# Patient Record
Sex: Female | Born: 1955 | Race: White | Hispanic: No | Marital: Married | Smoking: Former smoker
Health system: Southern US, Community
[De-identification: ages and names within clinical notes are randomized; demographics above are authoritative.]

## PROBLEM LIST (undated history)

## (undated) DIAGNOSIS — I2510 Atherosclerotic heart disease of native coronary artery without angina pectoris: Secondary | ICD-10-CM

## (undated) DIAGNOSIS — I10 Essential (primary) hypertension: Secondary | ICD-10-CM

## (undated) DIAGNOSIS — F419 Anxiety disorder, unspecified: Secondary | ICD-10-CM

## (undated) DIAGNOSIS — R062 Wheezing: Secondary | ICD-10-CM

## (undated) DIAGNOSIS — Z780 Asymptomatic menopausal state: Secondary | ICD-10-CM

## (undated) DIAGNOSIS — J069 Acute upper respiratory infection, unspecified: Secondary | ICD-10-CM

## (undated) DIAGNOSIS — M542 Cervicalgia: Secondary | ICD-10-CM

## (undated) DIAGNOSIS — E785 Hyperlipidemia, unspecified: Secondary | ICD-10-CM

## (undated) DIAGNOSIS — R5381 Other malaise: Secondary | ICD-10-CM

## (undated) DIAGNOSIS — R059 Cough, unspecified: Secondary | ICD-10-CM

## (undated) DIAGNOSIS — R011 Cardiac murmur, unspecified: Secondary | ICD-10-CM

## (undated) DIAGNOSIS — T733XXA Exhaustion due to excessive exertion, initial encounter: Secondary | ICD-10-CM

## (undated) DIAGNOSIS — U071 COVID-19: Secondary | ICD-10-CM

## (undated) DIAGNOSIS — R351 Nocturia: Secondary | ICD-10-CM

## (undated) DIAGNOSIS — G2581 Restless legs syndrome: Secondary | ICD-10-CM

## (undated) DIAGNOSIS — E860 Dehydration: Secondary | ICD-10-CM

## (undated) DIAGNOSIS — B029 Zoster without complications: Secondary | ICD-10-CM

## (undated) HISTORY — DX: Asymptomatic menopausal state: Z78.0

## (undated) HISTORY — DX: Wheezing: R06.2

## (undated) HISTORY — DX: Exhaustion due to excessive exertion, initial encounter: T73.3XXA

## (undated) HISTORY — DX: COVID-19: U07.1

## (undated) HISTORY — DX: Acute upper respiratory infection, unspecified: J06.9

## (undated) HISTORY — DX: Zoster without complications: B02.9

## (undated) HISTORY — DX: Cervicalgia: M54.2

## (undated) HISTORY — DX: Anxiety disorder, unspecified: F41.9

## (undated) HISTORY — DX: Nocturia: R35.1

## (undated) HISTORY — DX: Essential (primary) hypertension: I10

## (undated) HISTORY — DX: Cardiac murmur, unspecified: R01.1

## (undated) HISTORY — DX: Atherosclerotic heart disease of native coronary artery without angina pectoris: I25.10

## (undated) HISTORY — DX: Other malaise: R53.81

## (undated) HISTORY — DX: Hyperlipidemia, unspecified: E78.5

## (undated) HISTORY — DX: Restless legs syndrome: G25.81

## (undated) HISTORY — DX: Dehydration: E86.0

## (undated) HISTORY — DX: Cough, unspecified: R05.9

---

## 1997-03-07 ENCOUNTER — Other Ambulatory Visit: Admission: RE | Admit: 1997-03-07 | Discharge: 1997-03-07 | Payer: Self-pay | Admitting: Obstetrics and Gynecology

## 1999-05-21 ENCOUNTER — Other Ambulatory Visit: Admission: RE | Admit: 1999-05-21 | Discharge: 1999-05-21 | Payer: Self-pay | Admitting: Obstetrics and Gynecology

## 2000-05-22 ENCOUNTER — Other Ambulatory Visit: Admission: RE | Admit: 2000-05-22 | Discharge: 2000-05-22 | Payer: Self-pay | Admitting: Obstetrics and Gynecology

## 2001-05-25 ENCOUNTER — Other Ambulatory Visit: Admission: RE | Admit: 2001-05-25 | Discharge: 2001-05-25 | Payer: Self-pay | Admitting: Obstetrics and Gynecology

## 2001-06-11 ENCOUNTER — Ambulatory Visit (HOSPITAL_COMMUNITY): Admission: RE | Admit: 2001-06-11 | Discharge: 2001-06-11 | Payer: Self-pay | Admitting: Obstetrics and Gynecology

## 2001-06-11 ENCOUNTER — Encounter: Payer: Self-pay | Admitting: Obstetrics and Gynecology

## 2002-08-14 ENCOUNTER — Encounter: Payer: Self-pay | Admitting: Obstetrics and Gynecology

## 2002-08-14 ENCOUNTER — Ambulatory Visit (HOSPITAL_COMMUNITY): Admission: RE | Admit: 2002-08-14 | Discharge: 2002-08-14 | Payer: Self-pay | Admitting: Obstetrics and Gynecology

## 2003-08-18 ENCOUNTER — Ambulatory Visit (HOSPITAL_COMMUNITY): Admission: RE | Admit: 2003-08-18 | Discharge: 2003-08-18 | Payer: Self-pay | Admitting: Obstetrics and Gynecology

## 2003-09-08 ENCOUNTER — Ambulatory Visit (HOSPITAL_COMMUNITY): Admission: RE | Admit: 2003-09-08 | Discharge: 2003-09-08 | Payer: Self-pay | Admitting: Obstetrics and Gynecology

## 2004-06-30 ENCOUNTER — Ambulatory Visit (HOSPITAL_COMMUNITY): Admission: RE | Admit: 2004-06-30 | Discharge: 2004-06-30 | Payer: Self-pay | Admitting: Obstetrics and Gynecology

## 2004-08-19 ENCOUNTER — Ambulatory Visit (HOSPITAL_COMMUNITY): Admission: RE | Admit: 2004-08-19 | Discharge: 2004-08-19 | Payer: Self-pay | Admitting: Obstetrics and Gynecology

## 2004-11-09 ENCOUNTER — Ambulatory Visit (HOSPITAL_COMMUNITY): Admission: RE | Admit: 2004-11-09 | Discharge: 2004-11-09 | Payer: Self-pay | Admitting: Orthopedic Surgery

## 2004-12-22 ENCOUNTER — Ambulatory Visit (HOSPITAL_COMMUNITY): Admission: RE | Admit: 2004-12-22 | Discharge: 2004-12-22 | Payer: Self-pay | Admitting: Internal Medicine

## 2006-08-16 ENCOUNTER — Other Ambulatory Visit: Admission: RE | Admit: 2006-08-16 | Discharge: 2006-08-16 | Payer: Self-pay | Admitting: Gynecology

## 2007-08-21 ENCOUNTER — Other Ambulatory Visit: Admission: RE | Admit: 2007-08-21 | Discharge: 2007-08-21 | Payer: Self-pay | Admitting: Gynecology

## 2012-09-14 ENCOUNTER — Ambulatory Visit (INDEPENDENT_AMBULATORY_CARE_PROVIDER_SITE_OTHER): Payer: Federal, State, Local not specified - PPO | Admitting: Internal Medicine

## 2012-09-14 ENCOUNTER — Encounter: Payer: Self-pay | Admitting: Internal Medicine

## 2012-09-14 VITALS — BP 150/80 | HR 52 | Temp 97.0°F | Ht 66.0 in | Wt 148.2 lb

## 2012-09-14 DIAGNOSIS — R05 Cough: Secondary | ICD-10-CM

## 2012-09-14 DIAGNOSIS — R059 Cough, unspecified: Secondary | ICD-10-CM

## 2012-09-14 MED ORDER — AMOXICILLIN-POT CLAVULANATE 875-125 MG PO TABS: 1.0000 | ORAL_TABLET | Freq: Two times a day (BID) | ORAL | Status: DC

## 2012-09-14 MED ORDER — PREDNISONE (PAK) 10 MG PO TABS: ORAL_TABLET | ORAL | Status: DC

## 2012-09-14 MED ORDER — TRAMADOL HCL 50 MG PO TABS: ORAL_TABLET | ORAL | Status: DC

## 2012-09-14 NOTE — Progress Notes (Signed)
  Subjective:    Patient ID: Jordan Wolfe, female    DOB: 1955/10/13   MRN: [id number]  HPI  60 yowf quit smoking 1994  New onset cough referred by Ritta Slot to pulmonary clinic 09/14/12  09/14/2012 1st [facility] office visit/ Errica Dutil cc abrupt onset cold like symptoms first week in August 2014 with intermittently yellow mucus esp in am's immediately after waking.  rx  allegra muccinex.  Already zpak  No better with inhaler or otcs - only sob when coughing  No obvious  Day to day  variabilty or assoc cp or chest tightness, subjective wheeze overt sinus or hb symptoms. No unusual exp hx or h/o childhood pna/ asthma or knowledge of premature birth.    does not disturb sleep typically once asleep.  Also denies any obvious fluctuation of symptoms with weather or environmental changes or other aggravating or alleviating factors except as outlined above  Current Medications, Allergies, Past Medical History, Past Surgical History, Family History, and Social History were reviewed in [employer] record.         Review of Systems  Constitutional: Negative for fever, chills and unexpected weight change.  HENT: Positive for sore throat. Negative for ear pain, nosebleeds, congestion, rhinorrhea, sneezing, trouble swallowing, dental problem, voice change, postnasal drip and sinus pressure.   Eyes: Negative for visual disturbance.  Respiratory: Positive for shortness of breath. Negative for cough and choking.   Cardiovascular: Negative for chest pain and leg swelling.  Gastrointestinal: Negative for vomiting, abdominal pain and diarrhea.  Genitourinary: Negative for difficulty urinating.  Musculoskeletal: Negative for arthralgias.  Skin: Negative for rash.  Neurological: Positive for headaches. Negative for tremors and syncope.  Hematological: Does not bruise/bleed easily.       Objective:   Physical Exam  Pleasant amb wf nad nasal tone to voice   Wt Readings from  Last 3 Encounters:  09/14/12 148 lb 3.2 oz (67.223 kg)      HEENT: mod non specific turbinate edema but nl dentition,  , and orophanx. Nl external ear canals without cough reflex   NECK :  without JVD/Nodes/TM/ nl carotid upstrokes bilaterally   LUNGS: no acc muscle use, clear to A and P bilaterally without cough on insp or exp maneuvers   CV:  RRR  no s3 or murmur or increase in P2, no edema   ABD:  soft and nontender with nl excursion in the supine position. No bruits or organomegaly, bowel sounds nl  MS:  warm without deformities, calf tenderness, cyanosis or clubbing  SKIN: warm and dry without lesions    NEURO:  alert, approp, no deficits    CXR review Aug 22 2012  No active dz    Assessment & Plan:

## 2012-09-14 NOTE — Patient Instructions (Addendum)
augmentin 875 one twice daily with a glass of water and yogurt for lunch   Prednisone 10 mg take  4 each am x 2 days,   2 each am x 2 days,  1 each am x 2 days and stop   mucinex dm 1200 mg every 12 hours as needed for cough.   Tramadol 50 mg one every 4 hours if still coughing   pepcid ac 20 mg one after bfast and supper as long as you coughing   GERD (REFLUX)  is an extremely common cause of respiratory symptoms, many times with no significant heartburn at all.    It can be treated with medication, but also with lifestyle changes including avoidance of late meals, excessive alcohol, smoking cessation, and avoid fatty foods, chocolate, peppermint, colas, red wine, and acidic juices such as orange juice.  NO MINT OR MENTHOL PRODUCTS SO NO COUGH DROPS  USE SUGARLESS CANDY INSTEAD (jolley ranchers or Stover's)  NO OIL BASED VITAMINS - use powdered substitutes.    If not 100% better in 2 weeks need a sinus ct call [facility] [phone]

## 2012-09-15 DIAGNOSIS — R05 Cough: Secondary | ICD-10-CM | POA: Insufficient documentation

## 2012-09-15 DIAGNOSIS — R059 Cough, unspecified: Secondary | ICD-10-CM | POA: Insufficient documentation

## 2012-09-15 NOTE — Assessment & Plan Note (Addendum)
The most common causes of chronic cough in immunocompetent adults include the following: upper airway cough syndrome (UACS), previously referred to as postnasal drip syndrome (PNDS), which is caused by variety of rhinosinus conditions; (2) asthma; (3) GERD; (4) chronic bronchitis from cigarette smoking or other inhaled environmental irritants; (5) nonasthmatic eosinophilic bronchitis; and (6) bronchiectasis.   These conditions, singly or in combination, have accounted for up to 94% of the causes of chronic cough in prospective studies.   Other conditions have constituted no >6% of the causes in prospective studies These have included bronchogenic carcinoma, chronic interstitial pneumonia, sarcoidosis, left ventricular failure, ACEI-induced cough, and aspiration from a condition associated with pharyngeal dysfunction.    Chronic cough is often simultaneously caused by more than one condition. A single cause has been found from 38 to 82% of the time, multiple causes from 18 to 62%. Multiply caused cough has been the result of three diseases up to 42% of the time.       Most likely this is  Classic Upper airway cough syndrome, so named because it's frequently impossible to sort out how much is  CR/sinusitis with freq throat clearing (which can be related to primary GERD)   vs  causing  secondary (" extra esophageal")  GERD from wide swings in gastric pressure that occur with throat clearing, often  promoting self use of mint and menthol lozenges that reduce the lower esophageal sphincter tone and exacerbate the problem further in a cyclical fashion.   These are the same pts (now being labeled as having "irritable larynx syndrome" by some cough centers) who not infrequently have a history of having failed to tolerate ace inhibitors,  dry powder inhalers or biphosphonates or report having atypical reflux symptoms that don't respond to standard doses of PPI , and are easily confused as having aecopd or asthma  flares by even experienced allergists/ pulmonologists.   For now try max rx directed at sinuses (augmentin x 10 d)  and cyclical cough then sinus ct if not better  See instructions for specific recommendations which were reviewed directly with the patient who was given a copy with highlighter outlining the key components.

## 2014-09-02 ENCOUNTER — Emergency Department (HOSPITAL_COMMUNITY): Payer: Federal, State, Local not specified - PPO

## 2014-09-02 ENCOUNTER — Emergency Department (HOSPITAL_COMMUNITY)
Admission: EM | Admit: 2014-09-02 | Discharge: 2014-09-02 | Disposition: A | Discharge status: Home / Self Care | Payer: Federal, State, Local not specified - PPO | Attending: Emergency Medicine | Admitting: Emergency Medicine

## 2014-09-02 ENCOUNTER — Encounter (HOSPITAL_COMMUNITY): Payer: Self-pay | Admitting: *Deleted

## 2014-09-02 DIAGNOSIS — Z87891 Personal history of nicotine dependence: Secondary | ICD-10-CM | POA: Insufficient documentation

## 2014-09-02 DIAGNOSIS — S50311A Abrasion of right elbow, initial encounter: Secondary | ICD-10-CM | POA: Diagnosis not present

## 2014-09-02 DIAGNOSIS — Y998 Other external cause status: Secondary | ICD-10-CM | POA: Diagnosis not present

## 2014-09-02 DIAGNOSIS — S40011A Contusion of right shoulder, initial encounter: Secondary | ICD-10-CM | POA: Diagnosis not present

## 2014-09-02 DIAGNOSIS — S80212A Abrasion, left knee, initial encounter: Secondary | ICD-10-CM | POA: Diagnosis not present

## 2014-09-02 DIAGNOSIS — Z79899 Other long term (current) drug therapy: Secondary | ICD-10-CM | POA: Insufficient documentation

## 2014-09-02 DIAGNOSIS — S80211A Abrasion, right knee, initial encounter: Secondary | ICD-10-CM | POA: Insufficient documentation

## 2014-09-02 DIAGNOSIS — R410 Disorientation, unspecified: Secondary | ICD-10-CM | POA: Diagnosis not present

## 2014-09-02 DIAGNOSIS — Y92410 Unspecified street and highway as the place of occurrence of the external cause: Secondary | ICD-10-CM | POA: Insufficient documentation

## 2014-09-02 DIAGNOSIS — Y9389 Activity, other specified: Secondary | ICD-10-CM | POA: Diagnosis not present

## 2014-09-02 DIAGNOSIS — S0990XA Unspecified injury of head, initial encounter: Secondary | ICD-10-CM | POA: Diagnosis present

## 2014-09-02 LAB — BASIC METABOLIC PANEL
ANION GAP: 10 (ref 5–15)
BUN: 18 mg/dL (ref 6–20)
CALCIUM: 9.7 mg/dL (ref 8.9–10.3)
CO2: 24 mmol/L (ref 22–32)
CREATININE: 0.72 mg/dL (ref 0.44–1.00)
Chloride: 104 mmol/L (ref 101–111)
GFR calc Af Amer: >60 mL/min (ref >60)
GFR calc non Af Amer: >60 mL/min (ref >60)
GLUCOSE: 106 mg/dL — AB (ref 65–99)
Potassium: 5 mmol/L (ref 3.5–5.1)
Sodium: 138 mmol/L (ref 135–145)

## 2014-09-02 LAB — CBC
HEMATOCRIT: 39.2 % (ref 36.0–46.0)
Hemoglobin: 13.2 g/dL (ref 12.0–15.0)
MCH: 30.2 pg (ref 26.0–34.0)
MCHC: 33.7 g/dL (ref 30.0–36.0)
MCV: 89.7 fL (ref 78.0–100.0)
Platelets: 235 10*3/uL (ref 150–400)
RBC: 4.37 MIL/uL (ref 3.87–5.11)
RDW: 13.5 % (ref 11.5–15.5)
WBC: 7.8 10*3/uL (ref 4.0–10.5)

## 2014-09-02 MED ORDER — ONDANSETRON 4 MG PO TBDP
4.0000 mg | ORAL_TABLET | Freq: Once | ORAL | Status: AC
  Administered 2014-09-02: 4 mg via ORAL

## 2014-09-02 MED ORDER — ONDANSETRON 4 MG PO TBDP
ORAL_TABLET | ORAL | Status: AC
  Filled 2014-09-02: qty 1

## 2014-09-02 MED ORDER — OXYCODONE-ACETAMINOPHEN 5-325 MG PO TABS: 2.0000 | ORAL_TABLET | Freq: Once | ORAL | Status: DC

## 2014-09-02 MED ORDER — TETANUS-DIPHTH-ACELL PERTUSSIS 5-2.5-18.5 LF-MCG/0.5 IM SUSP
0.5000 mL | Freq: Once | INTRAMUSCULAR | Status: AC
  Administered 2014-09-02: 0.5 mL via INTRAMUSCULAR
  Filled 2014-09-02: qty 0.5
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
## 2014-09-02 NOTE — Discharge Instructions (Signed)
Concussion  A concussion, or closed-head injury, is a brain injury caused by a direct blow to the head or by a quick and sudden movement (jolt) of the head or neck. Concussions are usually not life-threatening. Even so, the effects of a concussion can be serious. If you have had a concussion before, you are more likely to experience concussion-like symptoms after a direct blow to the head.   CAUSES  · Direct blow to the head, such as from running into another player during a soccer game, being hit in a fight, or hitting your head on a hard surface.  · A jolt of the head or neck that causes the brain to move back and forth inside the skull, such as in a car crash.  SIGNS AND SYMPTOMS  The signs of a concussion can be hard to notice. Early on, they may be missed by you, family members, and health care providers. You may look fine but act or feel differently.  Symptoms are usually temporary, but they may last for days, weeks, or even longer. Some symptoms may appear right away while others may not show up for hours or days. Every head injury is different. Symptoms include:  · Mild to moderate headaches that will not go away.  · A feeling of pressure inside your head.  · Having more trouble than usual:  ¨ Learning or remembering things you have heard.  ¨ Answering questions.  ¨ Paying attention or concentrating.  ¨ Organizing daily tasks.  ¨ Making decisions and solving problems.  · Slowness in thinking, acting or reacting, speaking, or reading.  · Getting lost or being easily confused.  · Feeling tired all the time or lacking energy (fatigued).  · Feeling drowsy.  · Sleep disturbances.  ¨ Sleeping more than usual.  ¨ Sleeping less than usual.  ¨ Trouble falling asleep.  ¨ Trouble sleeping (insomnia).  · Loss of balance or feeling lightheaded or dizzy.  · Nausea or vomiting.  · Numbness or tingling.  · Increased sensitivity to:  ¨ Sounds.  ¨ Lights.  ¨ Distractions.  · Vision problems or eyes that tire  easily.  · Diminished sense of taste or smell.  · Ringing in the ears.  · Mood changes such as feeling sad or anxious.  · Becoming easily irritated or angry for little or no reason.  · Lack of motivation.  · Seeing or hearing things other people do not see or hear (hallucinations).  DIAGNOSIS  Your health care provider can usually diagnose a concussion based on a description of your injury and symptoms. He or she will ask whether you passed out (lost consciousness) and whether you are having trouble remembering events that happened right before and during your injury.  Your evaluation might include:  · A brain scan to look for signs of injury to the brain. Even if the test shows no injury, you may still have a concussion.  · Blood tests to be sure other problems are not present.  TREATMENT  · Concussions are usually treated in an emergency department, in urgent care, or at a clinic. You may need to stay in the hospital overnight for further treatment.  · Tell your health care provider if you are taking any medicines, including prescription medicines, over-the-counter medicines, and natural remedies. Some medicines, such as blood thinners (anticoagulants) and aspirin, may increase the chance of complications. Also tell your health care provider whether you have had alcohol or are taking illegal drugs. This information   may affect treatment.  · Your health care provider will send you home with important instructions to follow.  · How fast you will recover from a concussion depends on many factors. These factors include how severe your concussion is, what part of your brain was injured, your age, and how healthy you were before the concussion.  · Most people with mild injuries recover fully. Recovery can take time. In general, recovery is slower in older persons. Also, persons who have had a concussion in the past or have other medical problems may find that it takes longer to recover from their current injury.  HOME  CARE INSTRUCTIONS  General Instructions  · Carefully follow the directions your health care provider gave you.  · Only take over-the-counter or prescription medicines for pain, discomfort, or fever as directed by your health care provider.  · Take only those medicines that your health care provider has approved.  · Do not drink alcohol until your health care provider says you are well enough to do so. Alcohol and certain other drugs may slow your recovery and can put you at risk of further injury.  · If it is harder than usual to remember things, write them down.  · If you are easily distracted, try to do one thing at a time. For example, do not try to watch TV while fixing dinner.  · Talk with family members or close friends when making important decisions.  · Keep all follow-up appointments. Repeated evaluation of your symptoms is recommended for your recovery.  · Watch your symptoms and tell others to do the same. Complications sometimes occur after a concussion. Older adults with a brain injury may have a higher risk of serious complications, such as a blood clot on the brain.  · Tell your teachers, school nurse, school counselor, coach, athletic trainer, or work manager about your injury, symptoms, and restrictions. Tell them about what you can or cannot do. They should watch for:  ¨ Increased problems with attention or concentration.  ¨ Increased difficulty remembering or learning new information.  ¨ Increased time needed to complete tasks or assignments.  ¨ Increased irritability or decreased ability to cope with stress.  ¨ Increased symptoms.  · Rest. Rest helps the brain to heal. Make sure you:  ¨ Get plenty of sleep at night. Avoid staying up late at night.  ¨ Keep the same bedtime hours on weekends and weekdays.  ¨ Rest during the day. Take daytime naps or rest breaks when you feel tired.  · Limit activities that require a lot of thought or concentration. These include:  ¨ Doing homework or job-related  work.  ¨ Watching TV.  ¨ Working on the computer.  · Avoid any situation where there is potential for another head injury (football, hockey, soccer, basketball, martial arts, downhill snow sports and horseback riding). Your condition will get worse every time you experience a concussion. You should avoid these activities until you are evaluated by the appropriate follow-up health care providers.  Returning To Your Regular Activities  You will need to return to your normal activities slowly, not all at once. You must give your body and brain enough time for recovery.  · Do not return to sports or other athletic activities until your health care provider tells you it is safe to do so.  · Ask your health care provider when you can drive, ride a bicycle, or operate heavy machinery. Your ability to react may be slower after a   brain injury. Never do these activities if you are dizzy.  · Ask your health care provider about when you can return to work or school.  Preventing Another Concussion  It is very important to avoid another brain injury, especially before you have recovered. In rare cases, another injury can lead to permanent brain damage, brain swelling, or death. The risk of this is greatest during the first 7-10 days after a head injury. Avoid injuries by:  · Wearing a seat belt when riding in a car.  · Drinking alcohol only in moderation.  · Wearing a helmet when biking, skiing, skateboarding, skating, or doing similar activities.  · Avoiding activities that could lead to a second concussion, such as contact or recreational sports, until your health care provider says it is okay.  · Taking safety measures in your home.  ¨ Remove clutter and tripping hazards from floors and stairways.  ¨ Use grab bars in bathrooms and handrails by stairs.  ¨ Place non-slip mats on floors and in bathtubs.  ¨ Improve lighting in dim areas.  SEEK MEDICAL CARE IF:  · You have increased problems paying attention or  concentrating.  · You have increased difficulty remembering or learning new information.  · You need more time to complete tasks or assignments than before.  · You have increased irritability or decreased ability to cope with stress.  · You have more symptoms than before.  Seek medical care if you have any of the following symptoms for more than 2 weeks after your injury:  · Lasting (chronic) headaches.  · Dizziness or balance problems.  · Nausea.  · Vision problems.  · Increased sensitivity to noise or light.  · Depression or mood swings.  · Anxiety or irritability.  · Memory problems.  · Difficulty concentrating or paying attention.  · Sleep problems.  · Feeling tired all the time.  SEEK IMMEDIATE MEDICAL CARE IF:  · You have severe or worsening headaches. These may be a sign of a blood clot in the brain.  · You have weakness (even if only in one hand, leg, or part of the face).  · You have numbness.  · You have decreased coordination.  · You vomit repeatedly.  · You have increased sleepiness.  · One pupil is larger than the other.  · You have convulsions.  · You have slurred speech.  · You have increased confusion. This may be a sign of a blood clot in the brain.  · You have increased restlessness, agitation, or irritability.  · You are unable to recognize people or places.  · You have neck pain.  · It is difficult to wake you up.  · You have unusual behavior changes.  · You lose consciousness.  MAKE SURE YOU:  · Understand these instructions.  · Will watch your condition.  · Will get help right away if you are not doing well or get worse.  Document Released: 03/19/2003 Document Revised: 01/01/2013 Document Reviewed: 07/19/2012  Exit[employer]® Patient Information ©2015 [employer]. This information is not intended to replace advice given to you by your health care provider. Make sure you discuss any questions you have with your health care provider.
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
## 2015-04-13 DIAGNOSIS — Z85828 Personal history of other malignant neoplasm of skin: Secondary | ICD-10-CM | POA: Diagnosis not present

## 2015-04-13 DIAGNOSIS — L812 Freckles: Secondary | ICD-10-CM | POA: Diagnosis not present

## 2015-04-13 DIAGNOSIS — D225 Melanocytic nevi of trunk: Secondary | ICD-10-CM | POA: Diagnosis not present

## 2015-04-13 DIAGNOSIS — L821 Other seborrheic keratosis: Secondary | ICD-10-CM | POA: Diagnosis not present

## 2015-05-08 DIAGNOSIS — H3562 Retinal hemorrhage, left eye: Secondary | ICD-10-CM | POA: Diagnosis not present

## 2015-05-08 DIAGNOSIS — H5201 Hypermetropia, right eye: Secondary | ICD-10-CM | POA: Diagnosis not present

## 2015-05-08 DIAGNOSIS — H5212 Myopia, left eye: Secondary | ICD-10-CM | POA: Diagnosis not present

## 2015-05-14 DIAGNOSIS — H3561 Retinal hemorrhage, right eye: Secondary | ICD-10-CM | POA: Diagnosis not present

## 2015-06-16 DIAGNOSIS — H3561 Retinal hemorrhage, right eye: Secondary | ICD-10-CM | POA: Diagnosis not present

## 2015-09-21 DIAGNOSIS — K080 Exfoliation of teeth due to systemic causes: Secondary | ICD-10-CM | POA: Diagnosis not present

## 2015-10-20 DIAGNOSIS — Z1231 Encounter for screening mammogram for malignant neoplasm of breast: Secondary | ICD-10-CM | POA: Diagnosis not present

## 2015-12-16 DIAGNOSIS — H3561 Retinal hemorrhage, right eye: Secondary | ICD-10-CM | POA: Diagnosis not present

## 2016-01-13 DIAGNOSIS — Z6825 Body mass index (BMI) 25.0-25.9, adult: Secondary | ICD-10-CM | POA: Diagnosis not present

## 2016-01-13 DIAGNOSIS — Z01419 Encounter for gynecological examination (general) (routine) without abnormal findings: Secondary | ICD-10-CM | POA: Diagnosis not present

## 2016-03-25 DIAGNOSIS — H2513 Age-related nuclear cataract, bilateral: Secondary | ICD-10-CM | POA: Diagnosis not present

## 2016-03-25 DIAGNOSIS — H3561 Retinal hemorrhage, right eye: Secondary | ICD-10-CM | POA: Diagnosis not present

## 2016-03-25 DIAGNOSIS — H5212 Myopia, left eye: Secondary | ICD-10-CM | POA: Diagnosis not present

## 2016-04-04 DIAGNOSIS — K080 Exfoliation of teeth due to systemic causes: Secondary | ICD-10-CM | POA: Diagnosis not present

## 2016-04-19 DIAGNOSIS — Z85828 Personal history of other malignant neoplasm of skin: Secondary | ICD-10-CM | POA: Diagnosis not present

## 2016-04-19 DIAGNOSIS — D2262 Melanocytic nevi of left upper limb, including shoulder: Secondary | ICD-10-CM | POA: Diagnosis not present

## 2016-04-19 DIAGNOSIS — L570 Actinic keratosis: Secondary | ICD-10-CM | POA: Diagnosis not present

## 2016-04-19 DIAGNOSIS — D2261 Melanocytic nevi of right upper limb, including shoulder: Secondary | ICD-10-CM | POA: Diagnosis not present

## 2016-04-19 DIAGNOSIS — D225 Melanocytic nevi of trunk: Secondary | ICD-10-CM | POA: Diagnosis not present

## 2016-08-31 DIAGNOSIS — R5381 Other malaise: Secondary | ICD-10-CM | POA: Diagnosis not present

## 2016-08-31 DIAGNOSIS — Z0000 Encounter for general adult medical examination without abnormal findings: Secondary | ICD-10-CM | POA: Diagnosis not present

## 2016-08-31 DIAGNOSIS — H3561 Retinal hemorrhage, right eye: Secondary | ICD-10-CM | POA: Diagnosis not present

## 2016-09-06 DIAGNOSIS — Z1212 Encounter for screening for malignant neoplasm of rectum: Secondary | ICD-10-CM | POA: Diagnosis not present

## 2016-09-06 DIAGNOSIS — B029 Zoster without complications: Secondary | ICD-10-CM | POA: Diagnosis not present

## 2016-09-06 DIAGNOSIS — F418 Other specified anxiety disorders: Secondary | ICD-10-CM | POA: Diagnosis not present

## 2016-09-06 DIAGNOSIS — R030 Elevated blood-pressure reading, without diagnosis of hypertension: Secondary | ICD-10-CM | POA: Diagnosis not present

## 2016-09-06 DIAGNOSIS — Z23 Encounter for immunization: Secondary | ICD-10-CM | POA: Diagnosis not present

## 2016-09-06 DIAGNOSIS — Z0000 Encounter for general adult medical examination without abnormal findings: Secondary | ICD-10-CM | POA: Diagnosis not present

## 2016-09-06 DIAGNOSIS — Z780 Asymptomatic menopausal state: Secondary | ICD-10-CM | POA: Diagnosis not present

## 2016-09-06 DIAGNOSIS — Z1389 Encounter for screening for other disorder: Secondary | ICD-10-CM | POA: Diagnosis not present

## 2016-10-17 DIAGNOSIS — K080 Exfoliation of teeth due to systemic causes: Secondary | ICD-10-CM | POA: Diagnosis not present

## 2016-10-27 DIAGNOSIS — Z23 Encounter for immunization: Secondary | ICD-10-CM | POA: Diagnosis not present

## 2016-11-14 DIAGNOSIS — L03011 Cellulitis of right finger: Secondary | ICD-10-CM | POA: Diagnosis not present

## 2016-11-22 DIAGNOSIS — Z1231 Encounter for screening mammogram for malignant neoplasm of breast: Secondary | ICD-10-CM | POA: Diagnosis not present

## 2017-01-16 DIAGNOSIS — Z780 Asymptomatic menopausal state: Secondary | ICD-10-CM | POA: Diagnosis not present

## 2017-01-17 DIAGNOSIS — Z6824 Body mass index (BMI) 24.0-24.9, adult: Secondary | ICD-10-CM | POA: Diagnosis not present

## 2017-01-17 DIAGNOSIS — Z01419 Encounter for gynecological examination (general) (routine) without abnormal findings: Secondary | ICD-10-CM | POA: Diagnosis not present

## 2017-02-09 DIAGNOSIS — D259 Leiomyoma of uterus, unspecified: Secondary | ICD-10-CM | POA: Diagnosis not present

## 2017-02-09 DIAGNOSIS — Z809 Family history of malignant neoplasm, unspecified: Secondary | ICD-10-CM | POA: Diagnosis not present

## 2017-02-09 DIAGNOSIS — N950 Postmenopausal bleeding: Secondary | ICD-10-CM | POA: Diagnosis not present

## 2017-04-17 DIAGNOSIS — K080 Exfoliation of teeth due to systemic causes: Secondary | ICD-10-CM | POA: Diagnosis not present

## 2017-05-15 DIAGNOSIS — Z85828 Personal history of other malignant neoplasm of skin: Secondary | ICD-10-CM | POA: Diagnosis not present

## 2017-05-15 DIAGNOSIS — L814 Other melanin hyperpigmentation: Secondary | ICD-10-CM | POA: Diagnosis not present

## 2017-05-15 DIAGNOSIS — L570 Actinic keratosis: Secondary | ICD-10-CM | POA: Diagnosis not present

## 2017-05-15 DIAGNOSIS — D1801 Hemangioma of skin and subcutaneous tissue: Secondary | ICD-10-CM | POA: Diagnosis not present

## 2017-06-13 DIAGNOSIS — H43823 Vitreomacular adhesion, bilateral: Secondary | ICD-10-CM | POA: Diagnosis not present

## 2017-06-29 DIAGNOSIS — L570 Actinic keratosis: Secondary | ICD-10-CM | POA: Diagnosis not present

## 2017-10-09 DIAGNOSIS — Z0000 Encounter for general adult medical examination without abnormal findings: Secondary | ICD-10-CM | POA: Diagnosis not present

## 2017-10-09 DIAGNOSIS — Z79899 Other long term (current) drug therapy: Secondary | ICD-10-CM | POA: Diagnosis not present

## 2017-10-16 DIAGNOSIS — B029 Zoster without complications: Secondary | ICD-10-CM | POA: Diagnosis not present

## 2017-10-16 DIAGNOSIS — Z0000 Encounter for general adult medical examination without abnormal findings: Secondary | ICD-10-CM | POA: Diagnosis not present

## 2017-10-16 DIAGNOSIS — Z23 Encounter for immunization: Secondary | ICD-10-CM | POA: Diagnosis not present

## 2017-10-16 DIAGNOSIS — F418 Other specified anxiety disorders: Secondary | ICD-10-CM | POA: Diagnosis not present

## 2017-10-16 DIAGNOSIS — Z780 Asymptomatic menopausal state: Secondary | ICD-10-CM | POA: Diagnosis not present

## 2017-10-16 DIAGNOSIS — R030 Elevated blood-pressure reading, without diagnosis of hypertension: Secondary | ICD-10-CM | POA: Diagnosis not present

## 2017-10-17 DIAGNOSIS — R351 Nocturia: Secondary | ICD-10-CM | POA: Diagnosis not present

## 2017-10-25 DIAGNOSIS — R030 Elevated blood-pressure reading, without diagnosis of hypertension: Secondary | ICD-10-CM | POA: Diagnosis not present

## 2017-11-01 DIAGNOSIS — R030 Elevated blood-pressure reading, without diagnosis of hypertension: Secondary | ICD-10-CM | POA: Diagnosis not present

## 2017-11-02 DIAGNOSIS — K080 Exfoliation of teeth due to systemic causes: Secondary | ICD-10-CM | POA: Diagnosis not present

## 2017-12-28 DIAGNOSIS — Z1231 Encounter for screening mammogram for malignant neoplasm of breast: Secondary | ICD-10-CM | POA: Diagnosis not present

## 2018-06-11 DIAGNOSIS — Z01419 Encounter for gynecological examination (general) (routine) without abnormal findings: Secondary | ICD-10-CM | POA: Diagnosis not present

## 2018-06-11 DIAGNOSIS — Z6825 Body mass index (BMI) 25.0-25.9, adult: Secondary | ICD-10-CM | POA: Diagnosis not present

## 2018-07-02 DIAGNOSIS — L578 Other skin changes due to chronic exposure to nonionizing radiation: Secondary | ICD-10-CM | POA: Diagnosis not present

## 2018-07-02 DIAGNOSIS — D225 Melanocytic nevi of trunk: Secondary | ICD-10-CM | POA: Diagnosis not present

## 2018-07-02 DIAGNOSIS — Z85828 Personal history of other malignant neoplasm of skin: Secondary | ICD-10-CM | POA: Diagnosis not present

## 2018-07-02 DIAGNOSIS — L570 Actinic keratosis: Secondary | ICD-10-CM | POA: Diagnosis not present

## 2018-07-02 DIAGNOSIS — L821 Other seborrheic keratosis: Secondary | ICD-10-CM | POA: Diagnosis not present

## 2018-09-12 DIAGNOSIS — Z20828 Contact with and (suspected) exposure to other viral communicable diseases: Secondary | ICD-10-CM | POA: Diagnosis not present

## 2018-10-04 DIAGNOSIS — Z23 Encounter for immunization: Secondary | ICD-10-CM | POA: Diagnosis not present

## 2018-12-12 ENCOUNTER — Other Ambulatory Visit: Payer: Self-pay

## 2018-12-12 DIAGNOSIS — Z20822 Contact with and (suspected) exposure to covid-19: Secondary | ICD-10-CM

## 2018-12-15 LAB — NOVEL CORONAVIRUS, NAA: SARS-CoV-2, NAA: NOT DETECTED

## 2018-12-19 ENCOUNTER — Other Ambulatory Visit: Payer: Self-pay

## 2018-12-19 DIAGNOSIS — Z20822 Contact with and (suspected) exposure to covid-19: Secondary | ICD-10-CM

## 2018-12-20 LAB — NOVEL CORONAVIRUS, NAA: SARS-CoV-2, NAA: DETECTED — AB

## 2018-12-27 DIAGNOSIS — E7849 Other hyperlipidemia: Secondary | ICD-10-CM | POA: Diagnosis not present

## 2018-12-28 DIAGNOSIS — R030 Elevated blood-pressure reading, without diagnosis of hypertension: Secondary | ICD-10-CM | POA: Diagnosis not present

## 2018-12-28 DIAGNOSIS — Z1331 Encounter for screening for depression: Secondary | ICD-10-CM | POA: Diagnosis not present

## 2018-12-28 DIAGNOSIS — R05 Cough: Secondary | ICD-10-CM | POA: Diagnosis not present

## 2018-12-28 DIAGNOSIS — E785 Hyperlipidemia, unspecified: Secondary | ICD-10-CM | POA: Diagnosis not present

## 2018-12-28 DIAGNOSIS — Z0000 Encounter for general adult medical examination without abnormal findings: Secondary | ICD-10-CM | POA: Diagnosis not present

## 2018-12-28 DIAGNOSIS — U071 COVID-19: Secondary | ICD-10-CM | POA: Diagnosis not present

## 2019-01-07 DIAGNOSIS — Z1231 Encounter for screening mammogram for malignant neoplasm of breast: Secondary | ICD-10-CM | POA: Diagnosis not present

## 2019-02-21 ENCOUNTER — Ambulatory Visit: Payer: Federal, State, Local not specified - PPO

## 2019-03-15 ENCOUNTER — Ambulatory Visit: Payer: Federal, State, Local not specified - PPO

## 2019-04-18 DIAGNOSIS — H3561 Retinal hemorrhage, right eye: Secondary | ICD-10-CM | POA: Diagnosis not present

## 2019-04-18 DIAGNOSIS — H524 Presbyopia: Secondary | ICD-10-CM | POA: Diagnosis not present

## 2019-04-18 DIAGNOSIS — H2513 Age-related nuclear cataract, bilateral: Secondary | ICD-10-CM | POA: Diagnosis not present

## 2019-05-10 DIAGNOSIS — M25552 Pain in left hip: Secondary | ICD-10-CM | POA: Diagnosis not present

## 2019-05-10 DIAGNOSIS — M545 Low back pain: Secondary | ICD-10-CM | POA: Diagnosis not present

## 2019-12-02 ENCOUNTER — Ambulatory Visit (INDEPENDENT_AMBULATORY_CARE_PROVIDER_SITE_OTHER): Payer: Medicare Other

## 2019-12-02 ENCOUNTER — Other Ambulatory Visit: Payer: Self-pay

## 2019-12-02 ENCOUNTER — Ambulatory Visit (INDEPENDENT_AMBULATORY_CARE_PROVIDER_SITE_OTHER): Payer: Medicare Other | Admitting: Podiatry

## 2019-12-02 DIAGNOSIS — M779 Enthesopathy, unspecified: Secondary | ICD-10-CM

## 2019-12-02 DIAGNOSIS — M21611 Bunion of right foot: Secondary | ICD-10-CM

## 2019-12-02 DIAGNOSIS — M21619 Bunion of unspecified foot: Secondary | ICD-10-CM

## 2019-12-02 DIAGNOSIS — M21612 Bunion of left foot: Secondary | ICD-10-CM | POA: Diagnosis not present

## 2019-12-02 MED ORDER — TRIAMCINOLONE ACETONIDE 10 MG/ML IJ SUSP
10.0000 mg | Freq: Once | INTRAMUSCULAR | Status: AC
  Administered 2019-12-02: 10 mg

## 2019-12-03 NOTE — Progress Notes (Signed)
Subjective:   Patient ID: Jordan Wolfe, female   DOB: 64 y.o.   MRN: [id number]   HPI Patient states she has had a long-term bunion deformity left over right and states on the left 1 is becoming moderately increased in pain over the last couple months especially after playing tennis in a new shoe.  States it sore with pressure and shoe sore with shoe gear.  Patient does not smoke likes to be active   Review of Systems  All other systems reviewed and are negative.       Objective:  Physical Exam Vitals and nursing note reviewed.  Constitutional:      Appearance: She is well-developed.  Pulmonary:     Effort: Pulmonary effort is normal.  Musculoskeletal:        General: Normal range of motion.  Skin:    General: Skin is warm.  Neurological:     Mental Status: She is alert.     Neurovascular status found to be intact muscle strength was found to be adequate range of motion adequate.  Patient is noted to have moderate inflammation pain with fluid buildup around the first MPJ left with redness and moderate structural deformity also noted.  Patient is found to have good digital perfusion and is well oriented x3     Assessment:  Inflammatory capsulitis first MPJ left with bunion deformity also noted     Plan:  H&P reviewed condition.  At this point sterile prep done injected around the capsule 3 mg dexamethasone Kenalog 5 mg Xylocaine instructed on wider shoe gear soaks and if symptoms persist educated her on possible distal structural correction  X-ray indicates moderate deformity with no indications of pathology but large bump and bone spur formation on the medial side first metatarsal left

## 2019-12-30 ENCOUNTER — Other Ambulatory Visit: Payer: Self-pay | Admitting: Internal Medicine

## 2019-12-30 DIAGNOSIS — E785 Hyperlipidemia, unspecified: Secondary | ICD-10-CM

## 2019-12-30 LAB — IFOBT (OCCULT BLOOD): IFOBT: NEGATIVE

## 2020-01-29 ENCOUNTER — Ambulatory Visit
Admission: RE | Admit: 2020-01-29 | Discharge: 2020-01-29 | Disposition: A | Discharge status: Home / Self Care | Payer: Medicare Other | Source: Ambulatory Visit | Attending: Internal Medicine | Admitting: Internal Medicine

## 2020-01-29 ENCOUNTER — Other Ambulatory Visit: Payer: Self-pay

## 2020-01-29 DIAGNOSIS — E785 Hyperlipidemia, unspecified: Secondary | ICD-10-CM

## 2020-02-24 ENCOUNTER — Encounter: Payer: Self-pay | Admitting: Internal Medicine

## 2020-02-24 ENCOUNTER — Ambulatory Visit (INDEPENDENT_AMBULATORY_CARE_PROVIDER_SITE_OTHER): Payer: Medicare Other

## 2020-02-24 ENCOUNTER — Ambulatory Visit (INDEPENDENT_AMBULATORY_CARE_PROVIDER_SITE_OTHER): Payer: Medicare Other | Admitting: Internal Medicine

## 2020-02-24 ENCOUNTER — Other Ambulatory Visit: Payer: Self-pay

## 2020-02-24 ENCOUNTER — Encounter: Payer: Self-pay | Admitting: *Deleted

## 2020-02-24 VITALS — BP 118/80 | HR 76 | Ht 65.0 in | Wt 150.8 lb

## 2020-02-24 DIAGNOSIS — I491 Atrial premature depolarization: Secondary | ICD-10-CM

## 2020-02-24 DIAGNOSIS — I2510 Atherosclerotic heart disease of native coronary artery without angina pectoris: Secondary | ICD-10-CM

## 2020-02-24 DIAGNOSIS — I10 Essential (primary) hypertension: Secondary | ICD-10-CM

## 2020-02-24 DIAGNOSIS — E785 Hyperlipidemia, unspecified: Secondary | ICD-10-CM | POA: Insufficient documentation

## 2020-02-24 DIAGNOSIS — I2584 Coronary atherosclerosis due to calcified coronary lesion: Secondary | ICD-10-CM

## 2020-02-24 DIAGNOSIS — I700 Atherosclerosis of aorta: Secondary | ICD-10-CM | POA: Diagnosis not present

## 2020-02-24 DIAGNOSIS — R002 Palpitations: Secondary | ICD-10-CM

## 2020-02-24 NOTE — Progress Notes (Signed)
Cardiology Office Note:    Date:  02/24/2020   ID:  Jordan Wolfe, DOB Jan 10, 1956, MRN [id number]  PCP:  Crist Infante, [place]  Cardiologist:  Rudean Haskell MD Advanced Practice Provider:  No care team member to display Electrophysiologist:  None      CC: HLD Discussions Consulted for the evaluation of coronary artery calcification at the behest of Crist Infante, MD  History of Present Illness:    Jordan Wolfe is a 65 y.o. female with a hx of coronary artery calcification, HLD, HTN, Heart murmur NOS who presents for evaluation 02/24/20.  Patient notes that she is feeling great.  Patient notes that she exercises regular:  She plays and tennis and cycling.  Has had no chest pain, chest pressure, chest tightness, chest stinging.  Discomfort occasionally occurs at night (feels her heart racing; more at night), worsens with at night.  No syncope or near syncope  With racing heart; has felt in 2-3 times in this year. Does wake her from sleep. Patient exertion notable for riding 100 miles last Thursday and feels no symptoms.  No shortness of breath, DOE.  No PND or orthopnea.  No bendopnea, weight gain, leg swelling , or abdominal swelling.  No syncope or near syncope.   Patient reports NO prior cardiac testing including  echo,  stress test,  heart catheterizations,  cardioversion,  ablations.  No history of pre-eclampsia, early menarche, or prematurity.    Ambulatory BP 122/79.   Past Medical History:  Diagnosis Date  . Acute upper respiratory infection   . Anxiety   . Asymptomatic menopausal state   . CAD (coronary artery disease)   . Cough   . COVID-19   . Dehydration   . Exhaustion due to excessive exertion   . Hyperlipidemia   . Hypertension   . Malaise   . Murmur, cardiac   . Neck pain   . Nocturia   . RLS (restless legs syndrome)   . Wheezing   . Zoster without complications     No past surgical history on file.  Current  Medications: Current Meds  Medication Sig  . Multiple Vitamin (MULTI VITAMIN) TABS Take 1 tablet by mouth daily.  Marland Kitchen telmisartan (MICARDIS) 20 MG tablet every evening.  . zolpidem (AMBIEN) 5 MG tablet Take 1 tablet by mouth every night at bedtime as needed for insomnia     Allergies:   Patient has no known allergies.   Social History   Socioeconomic History  . Marital status: Married    Spouse name: Not on file  . Number of children: Not on file  . Years of education: Not on file  . Highest education level: Not on file  Occupational History  . Occupation: Retired  Tobacco Use  . Smoking status: Former Smoker    Packs/day: 0.25    Years: 2.00    Pack years: 0.50    Types: Cigarettes    Quit date: 01/11/1992    Years since quitting: 28.1  . Smokeless tobacco: Never Used  Substance and Sexual Activity  . Alcohol use: Yes    Comment: 2 glasses per day  . Drug use: No  . Sexual activity: Not on file  Other Topics Concern  . Not on file  Social History Narrative  . Not on file   Social Determinants of Health   Financial Resource Strain: Not on file  Food Insecurity: Not on file  Transportation Needs: Not on  file  Physical Activity: Not on file  Stress: Not on file  Social Connections: Not on file     Family History: The patient's family history includes Dementia in her mother; Healthy in her brother and father; Hypertension in her mother; Ovarian cancer in her mother. History of coronary artery disease notable for no members. History of heart failure notable for no members. History of arrhythmia notable for father needing pacemaker. No history of bicuspid aortic valve or aortic aneurysm or dissection.  ROS:   Please see the history of present illness.    All other systems reviewed and are negative.  EKGs/Labs/Other Studies Reviewed:    The following studies were reviewed today:  EKG:   02/24/20: Sr Rate 76 with rare PACs  CAC: Date: 01/29/2020 Personally  Reviewed Results: LAD Calcium with aortic athersclerosis IMPRESSION: 1. Coronary calcium score is 332 and this is at percentile 93 for patients of the same age, gender and ethnicity. 2.  Aortic Atherosclerosis (ICD10-I70.0). 3. Small nodular structures in the visualized lungs. Largest nodular structure measures roughly 3 mm. These are likely incidental findings but indeterminate. No follow-up needed if patient is low-risk (and has no known or suspected primary neoplasm). Non-contrast chest CT can be considered in 12 months if patient is high-risk. This recommendation follows the consensus statement: Guidelines for Management of Incidental Pulmonary Nodules Detected on CT Images: From the Fleischner Society 2017; Radiology 2017; 284:228-243.   Recent Labs: No results found for requested labs within last 8760 hours.  Recent Lipid Panel No results found for: CHOL, TRIG, HDL, CHOLHDL, VLDL, LDLCALC, LDLDIRECT  Outside Hospital  or Outside Clinic Studies (OSH):  Date: 12/25/2019 Cholesterol 223 HDL 83 LDL 129 Tgs 55 Creatinine 0.8 TSH 2.71  Risk Assessment/Calculations:     N/A  Physical Exam:    VS:  BP 118/80   Pulse 76   Ht 5\' 5"  (1.651 m)   Wt 150 lb 12.8 oz (68.4 kg)   SpO2 98%   BMI 25.09 kg/m     Wt Readings from Last 3 Encounters:  02/24/20 150 lb 12.8 oz (68.4 kg)  09/02/14 147 lb 3 oz (66.8 kg)  09/14/12 148 lb 3.2 oz (67.2 kg)     GEN:  Well nourished, well developed in no acute distress HEENT: Normal NECK: No JVD; No carotid bruits LYMPHATICS: No lymphadenopathy CARDIAC: regular rate with occasional extra heart beat, no murmurs, rubs, gallops RESPIRATORY:  Clear to auscultation without rales, wheezing or rhonchi  ABDOMEN: Soft, non-tender, non-distended MUSCULOSKELETAL:  No edema; No deformity  SKIN: Warm and dry NEUROLOGIC:  Alert and oriented x 3 PSYCHIATRIC:  Normal affect   ASSESSMENT:    1. PAC (premature atrial contraction)   2.  Hyperlipidemia, unspecified hyperlipidemia type   3. Aortic atherosclerosis ([place])   4. Coronary artery calcification   5. Palpitations   6. Essential hypertension    PLAN:    In order of problems listed above:  Aortic Atherosclerosis Coronary Artery Calcification HLD (unspecified) -LDL goal less than 70 - SHARED DECISION MAKING: discussed starting low dose statin; patient has significant reservations and would like to try dietary modifications first; showed patient CT scan and discussed statin risks and benefits -Recheck lipid profile LFTs fasting at next visit and will re-engage about statins - gave education on dietary changes  Palpitations: PACs - will obtain 14-day non live heart monitor (ZioPatch)   Essential Hypertension - ambulatory blood pressure  At goal, will continue ambulatory BP monitoring; gave education on how  to perform ambulatory blood pressure monitoring including the frequency and technique; goal ambulatory blood pressure < 135/85 on average - continue home medications   Three months follow up unless new symptoms or abnormal test results warranting change in plan  Would be reasonable for  APP Follow up   Medication Adjustments/Labs and Tests Ordered: Current medicines are reviewed at length with the patient today.  Concerns regarding medicines are outlined above.  Orders Placed This Encounter  Procedures  . Lipid panel  . LONG TERM MONITOR (3-14 DAYS)  . EKG 12-Lead   No orders of the defined types were placed in this encounter.   Patient Instructions  Medication Instructions:  Your physician recommends that you continue on your current medications as directed. Please refer to the Current Medication list given to you today.  *If you need a refill on your cardiac medications before your next appointment, please call your pharmacy*   Lab Work: IN 3 MONTHS: Fasting lipid panel If you have labs (blood work) drawn today and your tests are completely  normal, you will receive your results only by: Marland Kitchen MyChart Message (if you have MyChart) OR . A paper copy in the mail If you have any lab test that is abnormal or we need to change your treatment, we will call you to review the results.   Testing/Procedures: Your physician has requested that you wear a 14 day heart monitor.    Follow-Up: At [facility], you and your health needs are our priority.  As part of our continuing mission to provide you with exceptional heart care, we have created designated Provider Care Teams.  These Care Teams include your primary Cardiologist (physician) and Advanced Practice Providers (APPs -  Physician Assistants and Nurse Practitioners) who all work together to provide you with the care you need, when you need it.  We recommend signing up for the patient portal called "MyChart".  Sign up information is provided on this After Visit Summary.  MyChart is used to connect with patients for Virtual Visits (Telemedicine).  Patients are able to view lab/test results, encounter notes, upcoming appointments, etc.  Non-urgent messages can be sent to your provider as well.   To learn more about what you can do with MyChart, go to [email or web address].    Your next appointment:   3 month(s)  The format for your next appointment:   In Person  Provider:   You may see Gasper Sells, MD or one of the following Advanced Practice Providers on your designated Care Team:    Melina Copa, PA-C  Ermalinda Barrios, PA-C    Other Instructions  Bryn Gulling- Long Term Monitor Instructions   Your physician has requested you wear your ZIO patch monitor___14___days.   This is a single patch monitor.  [employer] supplies one patch monitor per enrollment.  Additional stickers are not available.   Please do not apply patch if you will be having a Nuclear Stress Test, Echocardiogram, Cardiac CT, MRI, or Chest Xray during the time frame you would be wearing the monitor. The patch cannot  be worn during these tests.  You cannot remove and re-apply the ZIO XT patch monitor.   Your ZIO patch monitor will be sent USPS Priority mail from [facility] directly to your home address. The monitor may also be mailed to a PO BOX if home delivery is not available.   It may take 3-5 days to receive your monitor after you have been enrolled.   Once you have received you  monitor, please review enclosed instructions.  Your monitor has already been registered assigning a specific monitor serial # to you.   Applying the monitor   Shave hair from upper left chest.   Hold abrader disc by orange tab.  Rub abrader in 40 strokes over left upper chest as indicated in your monitor instructions.   Clean area with 4 enclosed alcohol pads .  Use all pads to assure are is cleaned thoroughly.  Let dry.   Apply patch as indicated in monitor instructions.  Patch will be place under collarbone on left side of chest with arrow pointing upward.   Rub patch adhesive wings for 2 minutes.Remove white label marked "1".  Remove white label marked "2".  Rub patch adhesive wings for 2 additional minutes.   While looking in a mirror, press and release button in center of patch.  A small green light will flash 3-4 times .  This will be your only indicator the monitor has been turned on.     Do not shower for the first 24 hours.  You may shower after the first 24 hours.   Press button if you feel a symptom. You will hear a small click.  Record Date, Time and Symptom in the Patient Log Book.   When you are ready to remove patch, follow instructions on last 2 pages of Patient Log Book.  Stick patch monitor onto last page of Patient Log Book.   Place Patient Log Book in [place] box.  Use locking tab on box and tape box closed securely.  The [employer] has [employer] on it.  Please place in mailbox as soon as possible.  Your physician should have your test results approximately 7 days after the monitor  has been mailed back to [facility].   Call [place] at [phone] if you have questions regarding your ZIO XT patch monitor.  Call them immediately if you see an orange light blinking on your monitor.   If your monitor falls off in less than 4 days contact our Monitor department at [phone].  If your monitor becomes loose or falls off after 4 days call [employer] at [phone] for suggestions on securing your monitor.       Signed, Werner Lean, MD  02/24/2020 9:42 AM    [facility]

## 2020-02-24 NOTE — Progress Notes (Signed)
Patient ID: Jordan Wolfe, female   DOB: 07-29-1955, 65 y.o.   MRN: [id number] Patient enrolled for [employer] to ship a 14 day ZIO XT long term holter monitor to her home.

## 2020-02-24 NOTE — Patient Instructions (Signed)
Medication Instructions:  Your physician recommends that you continue on your current medications as directed. Please refer to the Current Medication list given to you today.  *If you need a refill on your cardiac medications before your next appointment, please call your pharmacy*   Lab Work: IN 3 MONTHS: Fasting lipid panel If you have labs (blood work) drawn today and your tests are completely normal, you will receive your results only by: [place] Kitchen MyChart Message (if you have MyChart) OR . A paper copy in the mail If you have any lab test that is abnormal or we need to change your treatment, we will call you to review the results.   Testing/Procedures: Your physician has requested that you wear a 14 day heart monitor.    Follow-Up: At [facility], you and your health needs are our priority.  As part of our continuing mission to provide you with exceptional heart care, we have created designated Provider Care Teams.  These Care Teams include your primary Cardiologist (physician) and Advanced Practice Providers (APPs -  Physician Assistants and Nurse Practitioners) who all work together to provide you with the care you need, when you need it.  We recommend signing up for the patient portal called "MyChart".  Sign up information is provided on this After Visit Summary.  MyChart is used to connect with patients for Virtual Visits (Telemedicine).  Patients are able to view lab/test results, encounter notes, upcoming appointments, etc.  Non-urgent messages can be sent to your provider as well.   To learn more about what you can do with MyChart, go to [email or web address].    Your next appointment:   3 month(s)  The format for your next appointment:   In Person  Provider:   You may see Gasper Sells, MD or one of the following Advanced Practice Providers on your designated Care Team:    Melina Copa, PA-C  Ermalinda Barrios, PA-C    Other Instructions  Bryn Gulling- Long Term Monitor  Instructions   Your physician has requested you wear your ZIO patch monitor___14___days.   This is a single patch monitor.  [employer] supplies one patch monitor per enrollment.  Additional stickers are not available.   Please do not apply patch if you will be having a Nuclear Stress Test, Echocardiogram, Cardiac CT, MRI, or Chest Xray during the time frame you would be wearing the monitor. The patch cannot be worn during these tests.  You cannot remove and re-apply the ZIO XT patch monitor.   Your ZIO patch monitor will be sent USPS Priority mail from [facility] directly to your home address. The monitor may also be mailed to a PO BOX if home delivery is not available.   It may take 3-5 days to receive your monitor after you have been enrolled.   Once you have received you monitor, please review enclosed instructions.  Your monitor has already been registered assigning a specific monitor serial # to you.   Applying the monitor   Shave hair from upper left chest.   Hold abrader disc by orange tab.  Rub abrader in 40 strokes over left upper chest as indicated in your monitor instructions.   Clean area with 4 enclosed alcohol pads .  Use all pads to assure are is cleaned thoroughly.  Let dry.   Apply patch as indicated in monitor instructions.  Patch will be place under collarbone on left side of chest with arrow pointing upward.   Rub patch adhesive wings for 2 minutes.Remove  white label marked "1".  Remove white label marked "2".  Rub patch adhesive wings for 2 additional minutes.   While looking in a mirror, press and release button in center of patch.  A small green light will flash 3-4 times .  This will be your only indicator the monitor has been turned on.     Do not shower for the first 24 hours.  You may shower after the first 24 hours.   Press button if you feel a symptom. You will hear a small click.  Record Date, Time and Symptom in the Patient Log Book.   When you are  ready to remove patch, follow instructions on last 2 pages of Patient Log Book.  Stick patch monitor onto last page of Patient Log Book.   Place Patient Log Book in [place] box.  Use locking tab on box and tape box closed securely.  The [employer] and [employer] has [employer] on it.  Please place in mailbox as soon as possible.  Your physician should have your test results approximately 7 days after the monitor has been mailed back to [facility].   Call [place] at [phone] if you have questions regarding your ZIO XT patch monitor.  Call them immediately if you see an orange light blinking on your monitor.   If your monitor falls off in less than 4 days contact our Monitor department at [phone].  If your monitor becomes loose or falls off after 4 days call [employer] at [phone] for suggestions on securing your monitor.

## 2020-02-27 DIAGNOSIS — I491 Atrial premature depolarization: Secondary | ICD-10-CM | POA: Diagnosis not present

## 2020-06-24 ENCOUNTER — Ambulatory Visit (INDEPENDENT_AMBULATORY_CARE_PROVIDER_SITE_OTHER): Payer: Medicare Other | Admitting: Internal Medicine

## 2020-06-24 ENCOUNTER — Telehealth: Payer: Self-pay

## 2020-06-24 ENCOUNTER — Other Ambulatory Visit: Payer: Self-pay

## 2020-06-24 ENCOUNTER — Encounter: Payer: Self-pay | Admitting: Internal Medicine

## 2020-06-24 ENCOUNTER — Other Ambulatory Visit: Payer: Medicare Other | Admitting: *Deleted

## 2020-06-24 VITALS — BP 138/80 | HR 60 | Ht 65.0 in | Wt 148.0 lb

## 2020-06-24 DIAGNOSIS — I491 Atrial premature depolarization: Secondary | ICD-10-CM

## 2020-06-24 DIAGNOSIS — I700 Atherosclerosis of aorta: Secondary | ICD-10-CM

## 2020-06-24 DIAGNOSIS — I471 Supraventricular tachycardia, unspecified: Secondary | ICD-10-CM | POA: Insufficient documentation

## 2020-06-24 DIAGNOSIS — E785 Hyperlipidemia, unspecified: Secondary | ICD-10-CM

## 2020-06-24 DIAGNOSIS — I2510 Atherosclerotic heart disease of native coronary artery without angina pectoris: Secondary | ICD-10-CM | POA: Diagnosis not present

## 2020-06-24 DIAGNOSIS — I10 Essential (primary) hypertension: Secondary | ICD-10-CM | POA: Diagnosis not present

## 2020-06-24 DIAGNOSIS — I2584 Coronary atherosclerosis due to calcified coronary lesion: Secondary | ICD-10-CM

## 2020-06-24 LAB — LIPID PANEL
Chol/HDL Ratio: 2.6 ratio (ref 0.0–4.4)
Cholesterol, Total: 220 mg/dL — ABNORMAL HIGH (ref 100–199)
HDL: 86 mg/dL (ref >39)
LDL Chol Calc (NIH): 126 mg/dL — ABNORMAL HIGH (ref 0–99)
Triglycerides: 46 mg/dL (ref 0–149)
VLDL Cholesterol Cal: 8 mg/dL (ref 5–40)

## 2020-06-24 MED ORDER — DILTIAZEM HCL 30 MG PO TABS: 30.0000 mg | ORAL_TABLET | Freq: Four times a day (QID) | ORAL | 4 refills | Status: DC | PRN

## 2020-06-24 MED ORDER — ROSUVASTATIN CALCIUM 10 MG PO TABS: 10.0000 mg | ORAL_TABLET | Freq: Every day | ORAL | 3 refills | Status: DC

## 2020-06-24 NOTE — Patient Instructions (Signed)
Medication Instructions:  Your physician has recommended you make the following change in your medication: START: diltiazem (Cardizem) 30 mg by mouth every 6 hours as needed for SVT/ palpitations  *If you need a refill on your cardiac medications before your next appointment, please call your pharmacy*   Lab Work: NONE If you have labs (blood work) drawn today and your tests are completely normal, you will receive your results only by: [place] (if you have MyChart) OR A paper copy in the mail If you have any lab test that is abnormal or we need to change your treatment, we will call you to review the results.   Testing/Procedures: NONE   Follow-Up: At [facility], you and your health needs are our priority.  As part of our continuing mission to provide you with exceptional heart care, we have created designated Provider Care Teams.  These Care Teams include your primary Cardiologist (physician) and Advanced Practice Providers (APPs -  Physician Assistants and Nurse Practitioners) who all work together to provide you with the care you need, when you need it.  We recommend signing up for the patient portal called "MyChart".  Sign up information is provided on this After Visit Summary.  MyChart is used to connect with patients for Virtual Visits (Telemedicine).  Patients are able to view lab/test results, encounter notes, upcoming appointments, etc.  Non-urgent messages can be sent to your provider as well.   To learn more about what you can do with MyChart, go to [email or web address].    Your next appointment:   12 month(s)  The format for your next appointment:   In Person  Provider:   You may see Rudean Haskell, MD or one of the following Advanced Practice Providers on your designated Care Team:   Melina Copa, PA-C Ermalinda Barrios, PA-C

## 2020-06-24 NOTE — Telephone Encounter (Signed)
Notified pt of results and MD recommendations.  She is agreeable to plan orders placed and f/u lab appointment scheduled.

## 2020-06-24 NOTE — Telephone Encounter (Signed)
-----   Message from Werner Lean, MD sent at 06/24/2020  4:45 PM EDT ----- Results: No change in LDL with diet and exercise Plan: Recommend rosuvastatin 10 mg Po Daily; lipids in 3 months  Werner Lean, MD

## 2020-06-24 NOTE — Progress Notes (Signed)
Cardiology Office Note:    Date:  06/24/2020   ID:  GENIENE LIST, DOB November 23, 1955, MRN [id number]  PCP:  Crist Infante, [place]  Cardiologist:  Rudean Haskell MD Advanced Practice Provider:  No care team member to display Electrophysiologist:  None      CC: HLD and CAC f/u  History of Present Illness:    Jordan Wolfe is a 65 y.o. female with a hx of coronary artery calcification, HLD, HTN, Heart murmur NOS who presents for evaluation 02/24/20. In interim of this visit, patient had short asymptomatic runs of SVT on her ZioPatch and was given as needed Diltiazem.  Patient notes that she is doing great.  Since last visit notes going to [place] .  Relevant interval testing or therapy include heart monitor.    No chest pain or pressure.  No SOB/DOE and no PND/Orthopnea.  No weight gain or leg swelling.  Notes rare palpitations at night (3 times since last visit).  Ambulatory blood pressure 130/80.    Past Medical History:  Diagnosis Date   Acute upper respiratory infection    Anxiety    Asymptomatic menopausal state    CAD (coronary artery disease)    Cough    COVID-19    Dehydration    Exhaustion due to excessive exertion    Hyperlipidemia    Hypertension    Malaise    Murmur, cardiac    Neck pain    Nocturia    RLS (restless legs syndrome)    Wheezing    Zoster without complications     No past surgical history on file.  Current Medications: Current Meds  Medication Sig   fluorometholone (FML) 0.1 % ophthalmic suspension Place 1 drop into both eyes in the morning and at bedtime.   LORazepam (ATIVAN) 0.5 MG tablet Take by mouth as needed.   Multiple Vitamin (MULTI VITAMIN) TABS Take 1 tablet by mouth daily.   telmisartan (MICARDIS) 20 MG tablet every evening.   zolpidem (AMBIEN) 5 MG tablet Take 1 tablet by mouth every night at bedtime as needed for insomnia     Allergies:   Patient has no known allergies.   Social History    Socioeconomic History   Marital status: Married    Spouse name: Not on file   Number of children: Not on file   Years of education: Not on file   Highest education level: Not on file  Occupational History   Occupation: Retired  Tobacco Use   Smoking status: Former    Packs/day: 0.25    Years: 2.00    Pack years: 0.50    Types: Cigarettes    Quit date: 01/11/1992    Years since quitting: 28.4   Smokeless tobacco: Never  Substance and Sexual Activity   Alcohol use: Yes    Comment: 2 glasses per day   Drug use: No   Sexual activity: Not on file  Other Topics Concern   Not on file  Social History Narrative   Not on file   Social Determinants of Health   Financial Resource Strain: Not on file  Food Insecurity: Not on file  Transportation Needs: Not on file  Physical Activity: Not on file  Stress: Not on file  Social Connections: Not on file     Family History: The patient's family history includes Dementia in her mother; Healthy in her brother and father; Hypertension in her mother; Ovarian cancer in her mother. History  of coronary artery disease notable for no members. History of heart failure notable for no members. History of arrhythmia notable for father needing pacemaker. No history of bicuspid aortic valve or aortic aneurysm or dissection.  ROS:   Please see the history of present illness.    All other systems reviewed and are negative.  EKGs/Labs/Other Studies Reviewed:    The following studies were reviewed today:  EKG:   02/24/20: SR Rate 76 with rare PACs  Cardiac Event Monitoring: Date: 03/20/20 Results: Patient had a minimum heart rate of 45 bpm, maximum heart rate of 266 bpm, and average heart rate of 73 bpm. Predominant underlying rhythm was sinus rhythm. One run of non-sustained ventricular tachycardia occurred lasting 7 beats at longest with a max rate of 255 bpm at fastest (morphologically this could be SVT with aberrancy). Forty-six runs of  supraventricular tachycardia occurred lasting 17 seconds at longest with a max rate of 182 bpm at fastest. Isolated PACs were rare (<1.0%), with rare couplets and triplets present. Isolated PVCs were rare (<1.0%), with rare couplets and trigeminy present. No evidence of complete heart block. Triggered and diary events associated with sinus rhythm, sinus tachycardia, and PVCs.   Asymptomatic SVT.  CAC: Date: 01/29/2020 Personally Reviewed Results: LAD Calcium with aortic athersclerosis IMPRESSION: 1. Coronary calcium score is 332 and this is at percentile 93 for patients of the same age, gender and ethnicity. 2.  Aortic Atherosclerosis (ICD10-I70.0). 3. Small nodular structures in the visualized lungs. Largest nodular structure measures roughly 3 mm. These are likely incidental findings but indeterminate. No follow-up needed if patient is low-risk (and has no known or suspected primary neoplasm). Non-contrast chest CT can be considered in 12 months if patient is high-risk. This recommendation follows the consensus statement: Guidelines for Management of Incidental Pulmonary Nodules Detected on CT Images: From the Fleischner Society 2017; Radiology 2017; 284:228-243.   Recent Labs: No results found for requested labs within last 8760 hours.  Recent Lipid Panel No results found for: CHOL, TRIG, HDL, CHOLHDL, VLDL, LDLCALC, LDLDIRECT  Outside Hospital  or Outside Clinic Studies (OSH):  Date: 12/25/2019 Cholesterol 223 HDL 83 LDL 129 Tgs 55 Creatinine 0.8 TSH 2.71  Risk Assessment/Calculations:     N/A  Physical Exam:    VS:  BP 138/80   Pulse 60   Ht 5\' 5"  (1.651 m)   Wt 67.1 kg   SpO2 98%   BMI 24.63 kg/m     Wt Readings from Last 3 Encounters:  06/24/20 67.1 kg  02/24/20 68.4 kg  09/02/14 66.8 kg     GEN:  Well nourished, well developed in no acute distress HEENT: Normal NECK: No JVD; No carotid bruits  LYMPHATICS: No lymphadenopathy CARDIAC: regular rate,  no murmurs, rubs, gallops RESPIRATORY:  Clear to auscultation without rales, wheezing or rhonchi  ABDOMEN: Soft, non-tender, non-distended MUSCULOSKELETAL:  No edema; No deformity  SKIN: Warm and dry NEUROLOGIC:  Alert and oriented x 3 PSYCHIATRIC:  Normal affect   ASSESSMENT:    1. Aortic atherosclerosis ([place])   2. Coronary artery calcification   3. PAC (premature atrial contraction)   4. Essential hypertension   5. Hyperlipidemia, unspecified hyperlipidemia type   6. Paroxysmal SVT (supraventricular tachycardia) (HCC)     PLAN:    In order of problems listed above:  Aortic Atherosclerosis Coronary Artery Calcification HLD HTN PACs and P-SVT -LDL goal less than 70 - rechecking lipids 06/24/20 (labs drawn) after diet and exercise recommendations from last night (rosuvastatin 20  mg) - gave education on dietary changes (alcohol and SVT relationship) - amb BP 130/80 - HTN at goal on telmisartan 20 mg  - will prescribe diltiazem 30 mg PO Q64hr PRN (discussed when and when not to take medication)  One year follow up unless new symptoms or abnormal test results warranting change in plan  Would be reasonable for  APP Follow up   Medication Adjustments/Labs and Tests Ordered: Current medicines are reviewed at length with the patient today.  Concerns regarding medicines are outlined above.  No orders of the defined types were placed in this encounter.  No orders of the defined types were placed in this encounter.   There are no Patient Instructions on file for this visit.   Signed, Werner Lean, MD  06/24/2020 9:17 AM    [place]

## 2020-07-02 ENCOUNTER — Telehealth: Payer: Self-pay | Admitting: Gastroenterology

## 2020-07-03 NOTE — Telephone Encounter (Signed)
FYI

## 2020-07-06 NOTE — Telephone Encounter (Signed)
Pre visit and colonoscopy scheduled.

## 2020-09-07 ENCOUNTER — Ambulatory Visit (AMBULATORY_SURGERY_CENTER): Payer: Medicare Other | Admitting: *Deleted

## 2020-09-07 ENCOUNTER — Other Ambulatory Visit: Payer: Self-pay

## 2020-09-07 VITALS — Ht 65.5 in | Wt 142.0 lb

## 2020-09-07 DIAGNOSIS — Z1211 Encounter for screening for malignant neoplasm of colon: Secondary | ICD-10-CM

## 2020-09-07 MED ORDER — PEG-KCL-NACL-NASULF-NA ASC-C 100 G PO SOLR: 1.0000 | Freq: Once | ORAL | 0 refills | Status: AC

## 2020-09-07 NOTE — Progress Notes (Signed)
Virtual pre-visit completed on telephone. Instructions sent through [place].   No egg or soy allergy known to patient  No issues with past sedation with any surgeries or procedures Patient denies ever being told they had issues or difficulty with intubation  No FH of Malignant Hyperthermia No diet pills per patient No home 02 use per patient  No blood thinners per patient  Pt denies issues with constipation  No A fib or A flutter  EMMI video to pt or via [place] 19 guidelines implemented in [place] today with Pt and RN   Pt is fully vaccinated  for Covid    Due to the COVID-19 pandemic we are asking patients to follow certain guidelines.  Pt aware of COVID protocols and [facility] guidelines

## 2020-09-21 ENCOUNTER — Other Ambulatory Visit: Payer: Self-pay

## 2020-09-21 ENCOUNTER — Other Ambulatory Visit: Payer: Medicare Other | Admitting: *Deleted

## 2020-09-21 ENCOUNTER — Telehealth: Payer: Self-pay

## 2020-09-21 DIAGNOSIS — I700 Atherosclerosis of aorta: Secondary | ICD-10-CM

## 2020-09-21 LAB — LIPID PANEL
Chol/HDL Ratio: 2.5 ratio (ref 0.0–4.4)
Cholesterol, Total: 175 mg/dL (ref 100–199)
HDL: 71 mg/dL (ref >39)
LDL Chol Calc (NIH): 87 mg/dL (ref 0–99)
Triglycerides: 95 mg/dL (ref 0–149)
VLDL Cholesterol Cal: 17 mg/dL (ref 5–40)

## 2020-09-21 NOTE — Telephone Encounter (Signed)
-----   Message from Daryel November, MD sent at 09/21/2020  6:27 AM EDT ----- Regarding: RE: [facility] pt Ok thanks John.  We will get her rescheduled. Addison Freimuth, can you get her a cardiology referral and cancel her colonoscopy. ----- Message ----- From: Osvaldo Angst, CRNA Sent: 09/21/2020   6:16 AM EDT To: Daryel November, MD Subject: [facility] pt                                         Dr. Candis Schatz,  This pt is scheduled with you on 9/19.  On 01/29/20 she had a cardiac CT which resulted in a score of 332 putting her in the 93 percentile.  In order to clear her for care at [facility] we will need a cardiac clearance.  Thanks,  Osvaldo Angst

## 2020-09-21 NOTE — Telephone Encounter (Signed)
[facility] Pre-operative Risk Assessment     Request for surgical clearance:     Endoscopy Procedure  What type of surgery is being performed?     colonoscopy  When is this surgery scheduled?     09/28/20  What type of clearance is required ?   Cardiac   Are there any medications that need to be held prior to surgery and how long? no  Practice name and name of physician performing surgery?      [facility]  What is your office phone and fax number?      Phone- [phone]  [phone]  Anesthesia type (None, local, MAC, general) ?       MAC

## 2020-09-21 NOTE — Progress Notes (Signed)
Error

## 2020-09-21 NOTE — Telephone Encounter (Signed)
Patient recently had OV with cardiology in June and blood work today.  Clearance letter sent to cardiology.  Patient understands that we may have to cancel her procedure and have her see cardiology for clearance if they are not able to grant without an OV

## 2020-09-22 NOTE — Telephone Encounter (Signed)
See cardiology clearance.  [place] to proceed in the [place]? See phone notes on 9/13 as well

## 2020-09-22 NOTE — Telephone Encounter (Addendum)
   Name: ADRIAN PRUSKI  DOB: July 07, 1955  MRN: [id number]   Primary Cardiologist: Werner Lean, MD  Chart reviewed as part of pre-operative protocol coverage.   Request was sent to Dr. Gasper Sells. Per his notes: Was functional without limitations at last visit.  Low risk and going for low risk procedure  Ok to proceed, medications can be held prior to surgery, has risk of paroxysmal SVT during procedure but this shouldn't have hemodynamic consequence   I will route this recommendation to the requesting party via [place] fax function and remove from pre-op pool. Please call with questions.  Tami Lin Kody Vigil, PA 09/22/2020, 11:56 AM

## 2020-09-23 ENCOUNTER — Telehealth: Payer: Self-pay

## 2020-09-23 DIAGNOSIS — I2510 Atherosclerotic heart disease of native coronary artery without angina pectoris: Secondary | ICD-10-CM

## 2020-09-23 DIAGNOSIS — E785 Hyperlipidemia, unspecified: Secondary | ICD-10-CM

## 2020-09-23 DIAGNOSIS — I2584 Coronary atherosclerosis due to calcified coronary lesion: Secondary | ICD-10-CM

## 2020-09-23 NOTE — Telephone Encounter (Signed)
Orders placed for f/u labs per pt request.

## 2020-09-23 NOTE — Telephone Encounter (Signed)
Patient notified cardiology and j Nulty, CGRN have cleared her for her procedure in the [facility] on 9/19

## 2020-09-23 NOTE — Telephone Encounter (Signed)
-----   Message from Werner Lean, MD sent at 09/23/2020  8:12 AM EDT ----- Results: LDL improved but not at goal Plan: Rosuvastatin to 40 mg PO daily and labs in three months  Werner Lean, MD

## 2020-09-28 ENCOUNTER — Other Ambulatory Visit: Payer: Self-pay

## 2020-09-28 ENCOUNTER — Other Ambulatory Visit: Payer: Federal, State, Local not specified - PPO

## 2020-09-28 ENCOUNTER — Ambulatory Visit (AMBULATORY_SURGERY_CENTER): Payer: Medicare Other | Admitting: Gastroenterology

## 2020-09-28 ENCOUNTER — Encounter: Payer: Self-pay | Admitting: Gastroenterology

## 2020-09-28 VITALS — BP 140/73 | HR 62 | Temp 98.4°F | Resp 14 | Ht 65.0 in | Wt 142.0 lb

## 2020-09-28 DIAGNOSIS — K6289 Other specified diseases of anus and rectum: Secondary | ICD-10-CM | POA: Diagnosis not present

## 2020-09-28 DIAGNOSIS — D129 Benign neoplasm of anus and anal canal: Secondary | ICD-10-CM

## 2020-09-28 DIAGNOSIS — K621 Rectal polyp: Secondary | ICD-10-CM

## 2020-09-28 DIAGNOSIS — D128 Benign neoplasm of rectum: Secondary | ICD-10-CM

## 2020-09-28 DIAGNOSIS — Z1211 Encounter for screening for malignant neoplasm of colon: Secondary | ICD-10-CM | POA: Diagnosis present

## 2020-09-28 MED ORDER — SODIUM CHLORIDE 0.9 % IV SOLN
500.0000 mL | Freq: Once | INTRAVENOUS | Status: AC
Start: 2020-09-28 — End: ?

## 2020-09-28 MED ORDER — ROSUVASTATIN CALCIUM 20 MG PO TABS: 20.0000 mg | ORAL_TABLET | Freq: Every day | ORAL | 3 refills | Status: DC

## 2020-09-28 NOTE — Op Note (Signed)
[name] Procedure Date: 09/28/2020 11:11 AM MRN: [id number] Endoscopist: Nicki Reaper E. Candis Schatz , MD Age: 65 Referring MD:  Date of Birth: 21-Apr-1955 Gender: Female Account #: [id number] Procedure:                Colonoscopy Indications:              Screening for colorectal malignant neoplasm Medicines:                Monitored Anesthesia Care Procedure:                Pre-Anesthesia Assessment:                           - Prior to the procedure, a History and Physical                            was performed, and patient medications and                            allergies were reviewed. The patient's tolerance of                            previous anesthesia was also reviewed. The risks                            and benefits of the procedure and the sedation                            options and risks were discussed with the patient.                            All questions were answered, and informed consent                            was obtained. Prior Anticoagulants: The patient has                            taken no previous anticoagulant or antiplatelet                            agents. ASA Grade Assessment: II - A patient with                            mild systemic disease. After reviewing the risks                            and benefits, the patient was deemed in                            satisfactory condition to undergo the procedure.                           After obtaining informed consent, the colonoscope  was passed under direct vision. Throughout the                            procedure, the patient's blood pressure, pulse, and                            oxygen saturations were monitored continuously. The                            CF [id number] was introduced through the anus                            and advanced to the the terminal ileum, with                            identification  of the appendiceal orifice and IC                            valve. The colonoscopy was performed without                            difficulty. The patient tolerated the procedure                            well. The quality of the bowel preparation was                            adequate. The ileocecal valve, appendiceal orifice,                            and rectum were photographed. Scope In: 11:23:41 AM Scope Out: 11:47:12 AM Scope Withdrawal Time: 0 hours 17 minutes 53 seconds  Total Procedure Duration: 0 hours 23 minutes 31 seconds  Findings:                 The perianal and digital rectal examinations were                            normal. Pertinent negatives include normal                            sphincter tone and no palpable rectal lesions.                           A 5 mm polyp was found in the distal rectum. The                            polyp was sessile. The polyp was removed with a                            cold snare. Resection and retrieval were complete.                            Estimated blood loss was  minimal. To prevent                            bleeding after the polypectomy, one hemostatic clip                            was successfully placed (MR conditional). There was                            no bleeding at the end of the procedure.                           A few small and large-mouthed diverticula were                            found in the sigmoid colon.                           The exam was otherwise normal throughout the                            examined colon.                           No additional abnormalities were found on                            retroflexion. Complications:            No immediate complications. Estimated Blood Loss:     Estimated blood loss was minimal. Impression:               - One 5 mm polyp in the distal rectum, removed with                            a cold snare. Resected and retrieved. Clip (MR                             conditional) was placed.                           - Diverticulosis in the sigmoid colon. Recommendation:           - Patient has a contact number available for                            emergencies. The signs and symptoms of potential                            delayed complications were discussed with the                            patient. Return to normal activities tomorrow.                            Written discharge instructions were provided to the  patient.                           - Resume previous diet.                           - Continue present medications.                           - Repeat colonoscopy (date not yet determined) for                            surveillance based on pathology results. Arad Burston E. Candis Schatz, MD 09/28/2020 11:57:20 AM This report has been signed electronically.

## 2020-09-28 NOTE — Progress Notes (Signed)
Called to room to assist during endoscopic procedure.  Patient ID and intended procedure confirmed with present staff. Received instructions for my participation in the procedure from the performing physician.  

## 2020-09-28 NOTE — Progress Notes (Signed)
Pt stable to PACU report given

## 2020-09-28 NOTE — Progress Notes (Signed)
Order placed for rosuvastatin 20 mg PO QD.

## 2020-09-28 NOTE — Patient Instructions (Signed)
YOU HAD AN ENDOSCOPIC PROCEDURE TODAY AT [facility]:   Refer to the procedure report that was given to you for any specific questions about what was found during the examination.  If the procedure report does not answer your questions, please call your gastroenterologist to clarify.  If you requested that your care partner not be given the details of your procedure findings, then the procedure report has been included in a sealed envelope for you to review at your convenience later.  YOU SHOULD EXPECT: Some feelings of bloating in the abdomen. Passage of more gas than usual.  Walking can help get rid of the air that was put into your GI tract during the procedure and reduce the bloating. If you had a lower endoscopy (such as a colonoscopy or flexible sigmoidoscopy) you may notice spotting of blood in your stool or on the toilet paper. If you underwent a bowel prep for your procedure, you may not have a normal bowel movement for a few days.  Please Note:  You might notice some irritation and congestion in your nose or some drainage.  This is from the oxygen used during your procedure.  There is no need for concern and it should clear up in a day or so.  SYMPTOMS TO REPORT IMMEDIATELY:  Following lower endoscopy (colonoscopy or flexible sigmoidoscopy):  Excessive amounts of blood in the stool  Significant tenderness or worsening of abdominal pains  Swelling of the abdomen that is new, acute  Fever of 100F or higher   For urgent or emergent issues, a gastroenterologist can be reached at any hour by calling [phone]. Do not use MyChart messaging for urgent concerns.    DIET:  We do recommend a small meal at first, but then you may proceed to your regular diet.  Drink plenty of fluids but you should avoid alcoholic beverages for 24 hours.  MEDICATIONS: Continue present medications.  Please see handouts given to you by your recovery nurse.  You have been given a "clip"  card documenting clip placement.   Thank you for allowing [place] to provide for your healthcare needs today.  ACTIVITY:  You should plan to take it easy for the rest of today and you should NOT DRIVE or use heavy machinery until tomorrow (because of the sedation medicines used during the test).    FOLLOW UP: Our staff will call the number listed on your records 48-72 hours following your procedure to check on you and address any questions or concerns that you may have regarding the information given to you following your procedure. If we do not reach you, we will leave a message.  We will attempt to reach you two times.  During this call, we will ask if you have developed any symptoms of COVID 19. If you develop any symptoms (ie: fever, flu-like symptoms, shortness of breath, cough etc.) before then, please call [phone].  If you test positive for Covid 19 in the 2 weeks post procedure, please call and report this information to [place].    If any biopsies were taken you will be contacted by phone or by letter within the next 1-3 weeks.  Please call us at [phone] if you have not heard about the biopsies in 3 weeks.    SIGNATURES/CONFIDENTIALITY: You and/or your care partner have signed paperwork which will be entered into your electronic medical record.  These signatures attest to the fact that that the information above on your After Visit Summary  has been reviewed and is understood.  Full responsibility of the confidentiality of this discharge information lies with you and/or your care-partner.  

## 2020-09-28 NOTE — Progress Notes (Signed)
[facility] History and Physical   Primary Care Physician:  Crist Infante, MD   Reason for Procedure:   Colon cancer screening  Plan:    Screening colonoscopy.     HPI: Jordan Wolfe is a 65 y.o. female here for average risk screening colonoscopy.  She had a normal colonoscopy in 2012.  She has no chronic GI symptoms and no family history of colon cancer.   Past Medical History:  Diagnosis Date   Acute upper respiratory infection    Anxiety    Asymptomatic menopausal state    CAD (coronary artery disease)    Cough    COVID-19    Dehydration    Exhaustion due to excessive exertion    Hyperlipidemia    Hypertension    Malaise    Murmur, cardiac    Neck pain    Nocturia    RLS (restless legs syndrome)    Wheezing    Zoster without complications     History reviewed. No pertinent surgical history.  Prior to Admission medications   Medication Sig Start Date End Date Taking? Authorizing Provider  cholecalciferol (VITAMIN D3) 25 MCG (1000 UNIT) tablet Take 1,000 Units by mouth daily.   Yes [provider]  rosuvastatin (CRESTOR) 20 MG tablet Take 1 tablet (20 mg total) by mouth daily. 09/28/20   Chandrasekhar, Mahesh A, MD  telmisartan (MICARDIS) 20 MG tablet every evening. 12/30/19  Yes [provider]  diltiazem (CARDIZEM) 30 MG tablet Take 1 tablet (30 mg total) by mouth every 6 (six) hours as needed (SVT). Patient not taking: No sig reported 06/24/20   Rudean Haskell A, MD  doxycycline (MONODOX) 100 MG capsule Take 100 mg by mouth 2 (two) times daily. Patient not taking: Reported on 09/28/2020 09/02/20   [provider]  LORazepam (ATIVAN) 0.5 MG tablet Take by mouth as needed. Patient not taking: Reported on 09/28/2020 05/19/20   [provider]  Multiple Vitamin (MULTI VITAMIN) TABS Take 1 tablet by mouth daily. Patient not taking: Reported on 09/28/2020    [provider]  zolpidem (AMBIEN) 5 MG tablet Take 1  tablet by mouth every night at bedtime as needed for insomnia Patient not taking: Reported on 09/28/2020 11/02/17   [provider]    Current Outpatient Medications  Medication Sig Dispense Refill   cholecalciferol (VITAMIN D3) 25 MCG (1000 UNIT) tablet Take 1,000 Units by mouth daily.     rosuvastatin (CRESTOR) 20 MG tablet Take 1 tablet (20 mg total) by mouth daily. 90 tablet 3   telmisartan (MICARDIS) 20 MG tablet every evening.     diltiazem (CARDIZEM) 30 MG tablet Take 1 tablet (30 mg total) by mouth every 6 (six) hours as needed (SVT). (Patient not taking: No sig reported) 30 tablet 4   doxycycline (MONODOX) 100 MG capsule Take 100 mg by mouth 2 (two) times daily. (Patient not taking: Reported on 09/28/2020)     LORazepam (ATIVAN) 0.5 MG tablet Take by mouth as needed. (Patient not taking: Reported on 09/28/2020)     Multiple Vitamin (MULTI VITAMIN) TABS Take 1 tablet by mouth daily. (Patient not taking: Reported on 09/28/2020)     zolpidem (AMBIEN) 5 MG tablet Take 1 tablet by mouth every night at bedtime as needed for insomnia (Patient not taking: Reported on 09/28/2020)     Current Facility-Administered Medications  Medication Dose Route Frequency Provider Last Rate Last Admin   0.9 %  sodium chloride infusion  500 mL Intravenous Once  Daryel November, MD        Allergies as of 09/28/2020   (No Known Allergies)    Family History  Problem Relation Age of Onset   Ovarian cancer Mother    Hypertension Mother    Dementia Mother    Healthy Father    Healthy Brother    Colon cancer Neg Hx    Colon polyps Neg Hx    Esophageal cancer Neg Hx    Rectal cancer Neg Hx    Stomach cancer Neg Hx     Social History   Socioeconomic History   Marital status: Married    Spouse name: Not on file   Number of children: Not on file   Years of education: Not on file   Highest education level: Not on file  Occupational History   Occupation: Retired  Tobacco Use   Smoking  status: Former    Packs/day: 0.25    Years: 2.00    Pack years: 0.50    Types: Cigarettes    Quit date: 01/11/1992    Years since quitting: 28.7   Smokeless tobacco: Never  Substance and Sexual Activity   Alcohol use: Yes    Comment: 2 glasses per day   Drug use: No   Sexual activity: Not on file  Other Topics Concern   Not on file  Social History Narrative   Not on file   Social Determinants of Health   Financial Resource Strain: Not on file  Food Insecurity: Not on file  Transportation Needs: Not on file  Physical Activity: Not on file  Stress: Not on file  Social Connections: Not on file  Intimate Partner Violence: Not on file    Review of Systems:  All other review of systems negative except as mentioned in the HPI.  Physical Exam: Vital signs BP (!) 143/94   Pulse 65   Temp 98.4 F (36.9 C) (Skin)   Ht '5\' 5"'$  (1.651 m)   Wt 142 lb (64.4 kg)   SpO2 99%   BMI 23.63 kg/m   General:   Alert,  Well-developed, well-nourished, pleasant and cooperative in NAD, MP2 Lungs:  Clear throughout to auscultation.   Heart:  Regular rate and rhythm; no murmurs, clicks, rubs,  or gallops. Abdomen:  Soft, nontender and nondistended. Normal bowel sounds.   Neuro/Psych:  Normal mood and affect. A and O x 3   Amerigo Mcglory E. Candis Schatz, MD [facility]

## 2020-09-28 NOTE — Progress Notes (Signed)
Pt's states no medical or surgical changes since previsit or office visit. VS assessed by N.C ?

## 2020-09-30 ENCOUNTER — Telehealth: Payer: Self-pay | Admitting: *Deleted

## 2020-09-30 NOTE — Telephone Encounter (Signed)
Attempted to call patient for their post-procedure follow-up call. No answer. Left voicemail.   

## 2020-09-30 NOTE — Telephone Encounter (Signed)
  Follow up Call-  Call back number 09/28/2020  Post procedure Call Back phone  # [phone]  Permission to leave phone message Yes  Some recent data might be hidden     Patient questions:  Do you have a fever, pain , or abdominal swelling? No. Pain Score  0 *  Have you tolerated food without any problems? Yes.    Have you been able to return to your normal activities? Yes.    Do you have any questions about your discharge instructions: Diet   No. Medications  No. Follow up visit  No.  Do you have questions or concerns about your Care? No.  Actions: * If pain score is 4 or above: No action needed, pain <4.  Have you developed a fever since your procedure? no  2.   Have you had an respiratory symptoms (SOB or cough) since your procedure? no  3.   Have you tested positive for COVID 19 since your procedure no  4.   Have you had any family members/close contacts diagnosed with the COVID 19 since your procedure?  no   If yes to any of these questions please route to Joylene John, RN and Joella Prince, RN

## 2020-10-06 ENCOUNTER — Encounter: Payer: Self-pay | Admitting: Gastroenterology

## 2020-12-21 ENCOUNTER — Other Ambulatory Visit: Payer: Self-pay

## 2020-12-21 ENCOUNTER — Other Ambulatory Visit: Payer: Medicare Other | Admitting: *Deleted

## 2020-12-21 DIAGNOSIS — I2584 Coronary atherosclerosis due to calcified coronary lesion: Secondary | ICD-10-CM

## 2020-12-21 DIAGNOSIS — E785 Hyperlipidemia, unspecified: Secondary | ICD-10-CM

## 2020-12-21 DIAGNOSIS — I2510 Atherosclerotic heart disease of native coronary artery without angina pectoris: Secondary | ICD-10-CM

## 2020-12-21 LAB — LIPID PANEL
Chol/HDL Ratio: 2 ratio (ref 0.0–4.4)
Cholesterol, Total: 165 mg/dL (ref 100–199)
HDL: 83 mg/dL (ref >39)
LDL Chol Calc (NIH): 71 mg/dL (ref 0–99)
Triglycerides: 52 mg/dL (ref 0–149)
VLDL Cholesterol Cal: 11 mg/dL (ref 5–40)

## 2020-12-21 LAB — HEPATIC FUNCTION PANEL
ALT: 26 IU/L (ref 0–32)
AST: 36 IU/L (ref 0–40)
Albumin: 4.6 g/dL (ref 3.8–4.8)
Alkaline Phosphatase: 73 IU/L (ref 44–121)
Bilirubin Total: 0.4 mg/dL (ref 0.0–1.2)
Bilirubin, Direct: 0.16 mg/dL (ref 0.00–0.40)
Total Protein: 6.5 g/dL (ref 6.0–8.5)

## 2021-05-09 IMAGING — CT CT CARDIAC CORONARY ARTERY CALCIUM SCORE
3 series · 13 of 20 positions shown, 15 images · non-contrast
Comparison: None.

CLINICAL DATA: 64-year-old white female with hyperlipidemia.

EXAM:
CT CARDIAC CORONARY ARTERY CALCIUM SCORE
TECHNIQUE: Non-contrast imaging through the heart was performed using
prospective ECG gating. Image post processing was performed on an
independent workstation, allowing for quantitative analysis of the
heart and coronary arteries. Note that this exam targets the heart
and the chest was not imaged in its entirety.

[Series 2: calcium scoring 2.00 qr36 bestdiast 60% hrt calciu · axial · 0.35mm/px · z∈[+1592,+1648]mm · 3 of 70 slices shown]
[im 14/70  vessel]
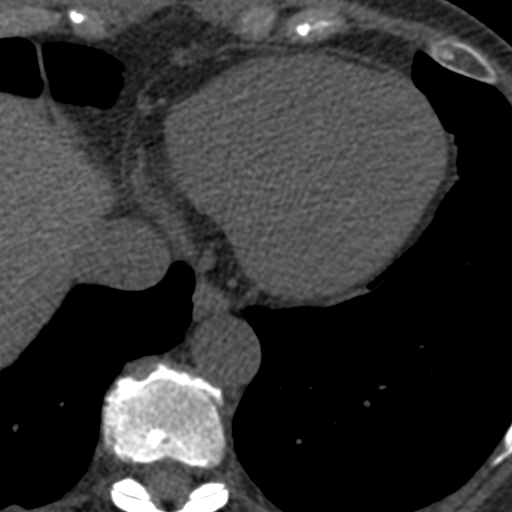
[im 28/70  vessel]
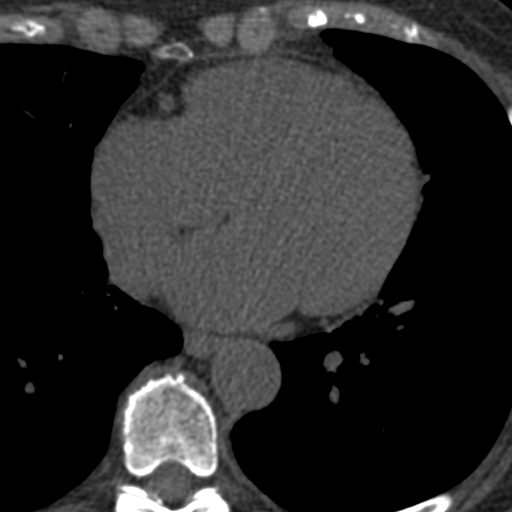
[im 42/70  vessel]
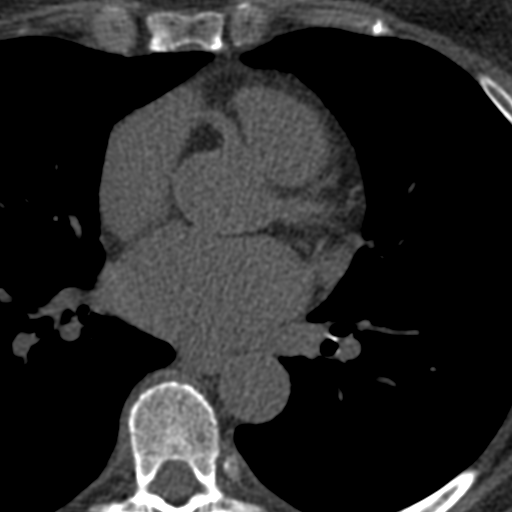

[Series 3: calcium scoring 2.00 br40 bestdiast 60% axial · axial · 0.50mm/px · z∈[+1588,+1680]mm · 5 of 70 slices shown, 7 images]
[im 12/70  vessel]
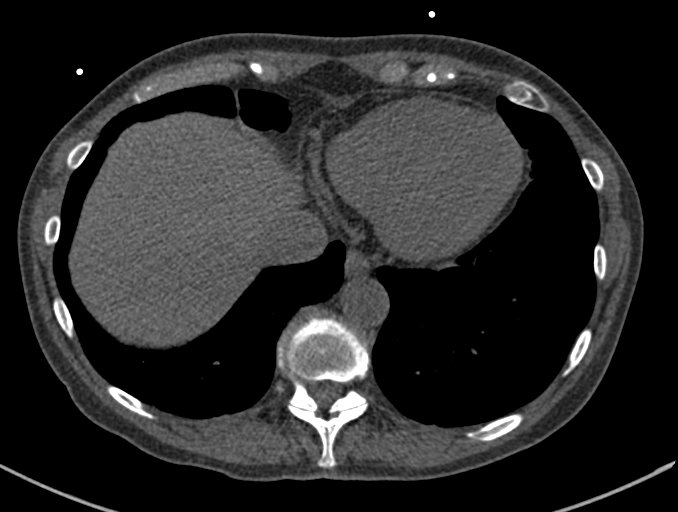
[im 12/70  lung]
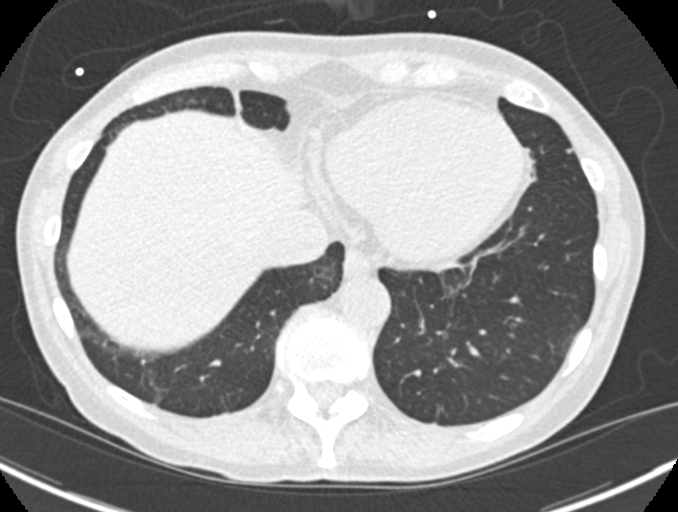
[im 24/70  vessel]
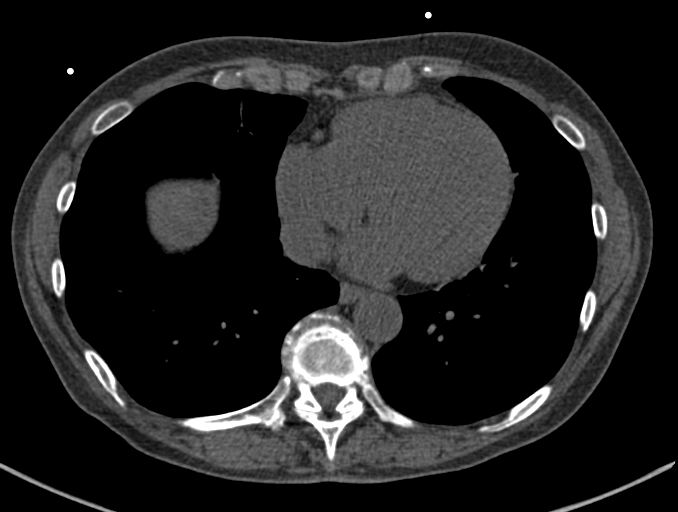
[im 35/70  vessel]
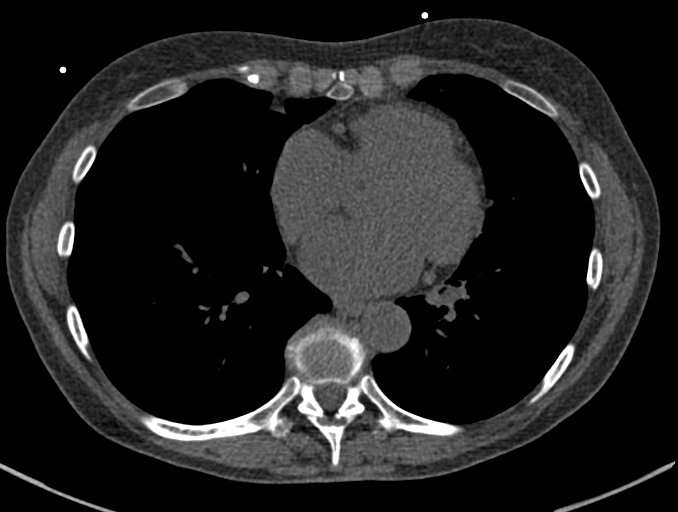
[im 47/70  vessel]
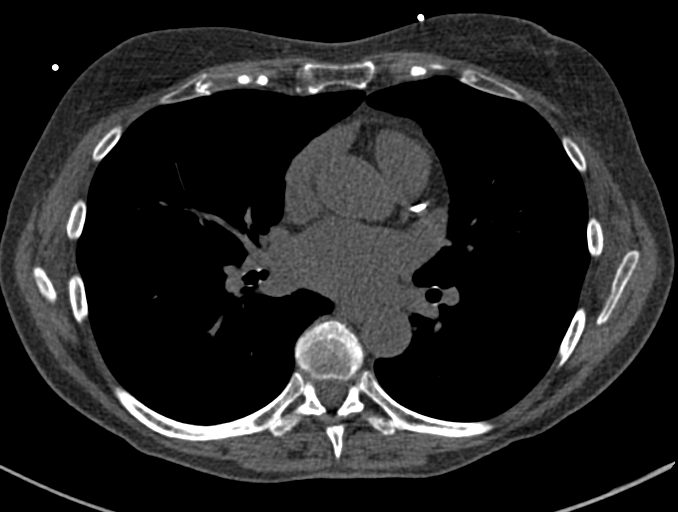
[im 58/70  vessel]
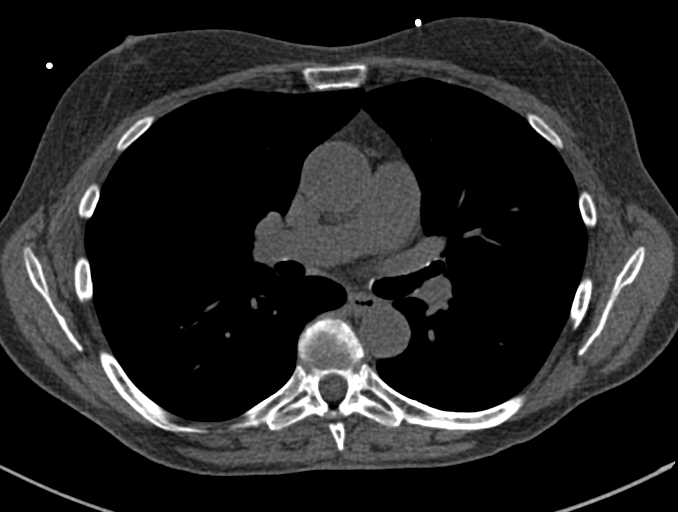
[im 58/70  lung]
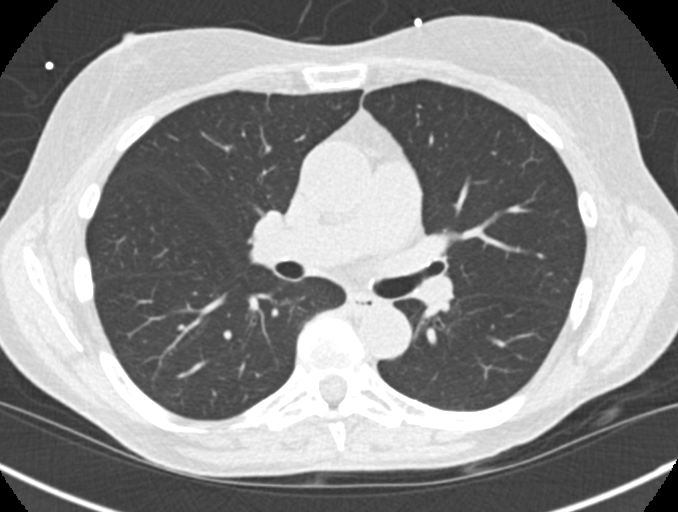

[Series 9: calcium scoring 2.00 br60 bestdiast 60% lungs · axial · 0.49mm/px · z∈[+1588,+1680]mm · 5 of 70 slices shown]
[im 12/70  vessel]
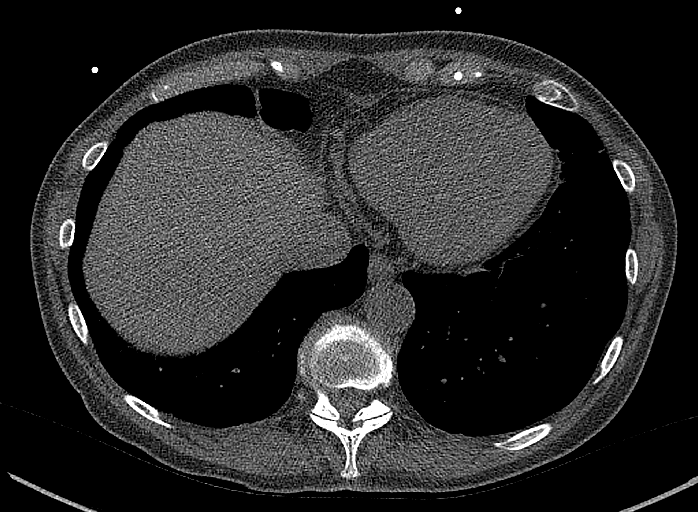
[im 24/70  vessel]
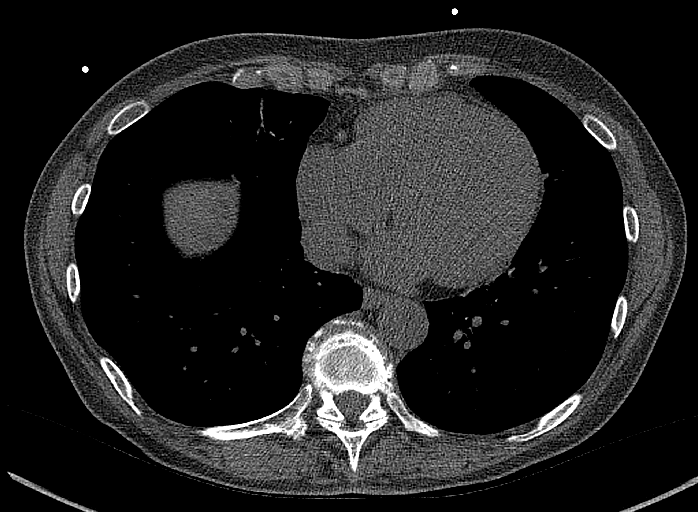
[im 35/70  vessel]
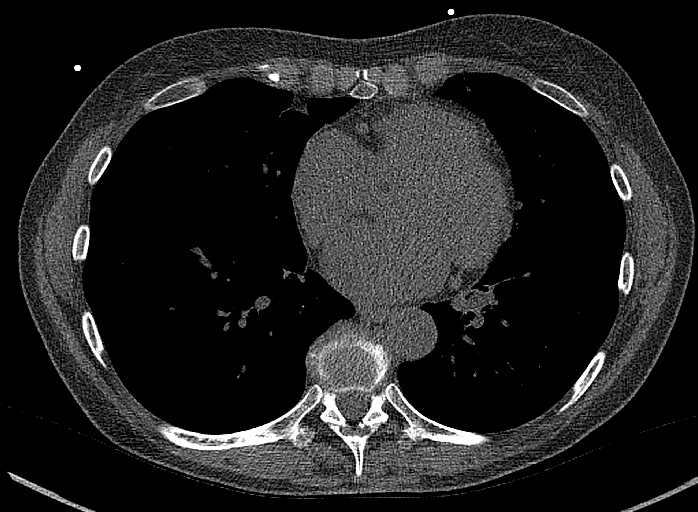
[im 47/70  vessel]
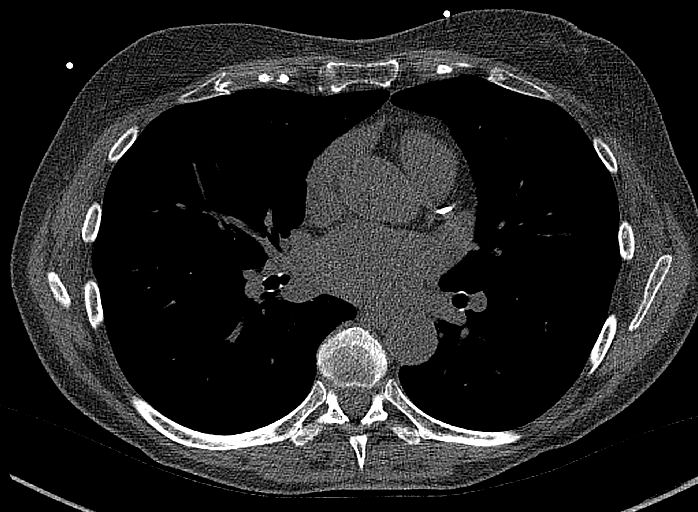
[im 58/70  vessel]
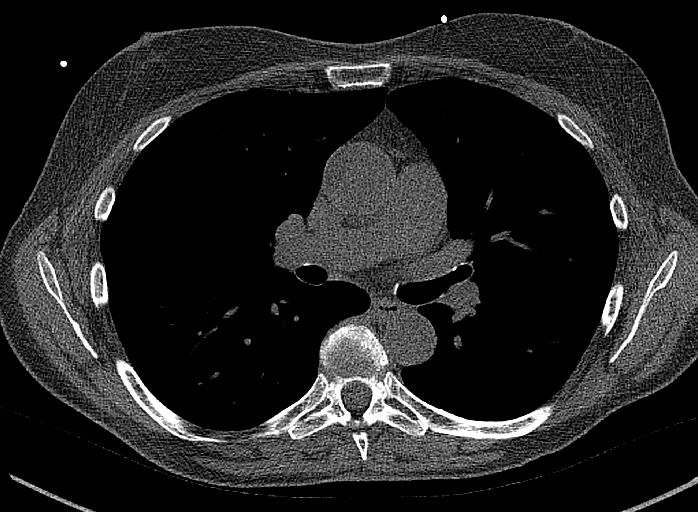

[13 of 20 positions shown; findings below may reference images not displayed]

FINDINGS: CORONARY CALCIUM SCORES:

Left Main: 0

LAD: 309

LCx:

RCA:

Total Agatston Score: 332

[HOSPITAL] percentile: 93

AORTA MEASUREMENTS:

Ascending Aorta: 35 mm

Descending Aorta: 26 mm

OTHER FINDINGS:

Heart size is normal. Atherosclerotic calcifications at the aortic
root. No significant pericardial fluid. Visualized mediastinal and
hilar structures are unremarkable. Visualized upper abdominal
structures are unremarkable. 2 mm nodule in the right middle lobe on
sequence 9 image 46. 3 mm nodule along the right major fissure on
sequence 9, image 51. Small pleural-based densities in the posterior
right lower lobe probably represent dependent densities and
atelectasis. No significant airspace disease or consolidation in the
visualized lungs. Punctate pleural-based density in the lingula on
sequence 9, image 59. No acute bone abnormality
IMPRESSION: 1. Coronary calcium score is 332 and this is at percentile 93 for
patients of the same age, gender and ethnicity.
2.  Aortic Atherosclerosis (GHOU2-OF9.9).
3. Small nodular structures in the visualized lungs. Largest nodular
structure measures roughly 3 mm. These are likely incidental
findings but indeterminate. No follow-up needed if patient is
low-risk (and has no known or suspected primary neoplasm).
Non-contrast chest CT can be considered in 12 months if patient is
high-risk. This recommendation follows the consensus statement:
Guidelines for Management of Incidental Pulmonary Nodules Detected

## 2021-10-02 ENCOUNTER — Other Ambulatory Visit: Payer: Self-pay | Admitting: Internal Medicine

## 2021-11-03 ENCOUNTER — Ambulatory Visit: Payer: Medicare Other | Admitting: Internal Medicine

## 2021-11-05 ENCOUNTER — Ambulatory Visit: Payer: Medicare Other | Attending: Internal Medicine | Admitting: Internal Medicine

## 2021-11-05 ENCOUNTER — Encounter: Payer: Self-pay | Admitting: Internal Medicine

## 2021-11-05 ENCOUNTER — Telehealth: Payer: Self-pay | Admitting: Internal Medicine

## 2021-11-05 VITALS — BP 120/82 | HR 60 | Ht 65.5 in | Wt 150.8 lb

## 2021-11-05 DIAGNOSIS — I10 Essential (primary) hypertension: Secondary | ICD-10-CM

## 2021-11-05 DIAGNOSIS — R072 Precordial pain: Secondary | ICD-10-CM | POA: Insufficient documentation

## 2021-11-05 DIAGNOSIS — E785 Hyperlipidemia, unspecified: Secondary | ICD-10-CM | POA: Insufficient documentation

## 2021-11-05 DIAGNOSIS — I471 Supraventricular tachycardia, unspecified: Secondary | ICD-10-CM | POA: Insufficient documentation

## 2021-11-05 DIAGNOSIS — I700 Atherosclerosis of aorta: Secondary | ICD-10-CM | POA: Diagnosis present

## 2021-11-05 DIAGNOSIS — I2510 Atherosclerotic heart disease of native coronary artery without angina pectoris: Secondary | ICD-10-CM | POA: Insufficient documentation

## 2021-11-05 DIAGNOSIS — I491 Atrial premature depolarization: Secondary | ICD-10-CM | POA: Insufficient documentation

## 2021-11-05 DIAGNOSIS — I2584 Coronary atherosclerosis due to calcified coronary lesion: Secondary | ICD-10-CM

## 2021-11-05 MED ORDER — ASPIRIN 81 MG PO TBEC: 81.0000 mg | DELAYED_RELEASE_TABLET | Freq: Every day | ORAL | 12 refills | Status: DC

## 2021-11-05 MED ORDER — ROSUVASTATIN CALCIUM 40 MG PO TABS: 40.0000 mg | ORAL_TABLET | Freq: Every day | ORAL | 3 refills | Status: DC

## 2021-11-05 NOTE — Progress Notes (Signed)
Cardiology Office Note:    Date:  11/05/2021   ID:  Jordan Wolfe, DOB 1955-11-10, MRN [id number]  PCP:  Crist Infante, [place]  Cardiologist:  Rudean Haskell MD Advanced Practice Provider:  No care team member to display Electrophysiologist:  None      CC:: CAC f/u  History of Present Illness:    Jordan Wolfe is a 66 y.o. female with a hx of coronary artery calcification, HLD, HTN, Heart murmur NOS who presents for evaluation 02/24/20.  2022: In interim of this visit, patient had short asymptomatic runs of SVT on her ZioPatch and was given as needed Diltiazem.  Went to [place].  Patient notes that she is doing well.   Has need not needed the PRN diltiazem.  There are no interval hospital/ED visit.    Rarely, when cycling, has abdominal discomfort.  Doesn't happen when on the tennis court.   No SOB/DOE and no PND/Orthopnea.  No weight gain or leg swelling.  No palpitations or syncope.  Past Medical History:  Diagnosis Date   Acute upper respiratory infection    Anxiety    Asymptomatic menopausal state    CAD (coronary artery disease)    Cough    COVID-19    Dehydration    Exhaustion due to excessive exertion    Hyperlipidemia    Hypertension    Malaise    Murmur, cardiac    Neck pain    Nocturia    RLS (restless legs syndrome)    Wheezing    Zoster without complications     No past surgical history on file.  Current Medications: Current Meds  Medication Sig   aspirin EC 81 MG tablet Take 1 tablet (81 mg total) by mouth daily. Swallow whole.   cholecalciferol (VITAMIN D3) 25 MCG (1000 UNIT) tablet Take 1,000 Units by mouth daily.   diltiazem (CARDIZEM) 30 MG tablet Take 1 tablet (30 mg total) by mouth every 6 (six) hours as needed (SVT).   ezetimibe (ZETIA) 10 MG tablet Take 10 mg by mouth daily.   LORazepam (ATIVAN) 0.5 MG tablet Take by mouth as needed.   rosuvastatin (CRESTOR) 40 MG tablet Take 1 tablet (40 mg total) by  mouth daily.   telmisartan (MICARDIS) 20 MG tablet every evening.   zolpidem (AMBIEN) 5 MG tablet    [DISCONTINUED] rosuvastatin (CRESTOR) 20 MG tablet Take 1 tablet (20 mg total) by mouth daily. Please call to schedule appointment for future refills. 1st attempt   Current Facility-Administered Medications for the 11/05/21 encounter (Office Visit) with Werner Lean, MD  Medication   0.9 %  sodium chloride infusion     Allergies:   Patient has no known allergies.   Social History   Socioeconomic History   Marital status: Married    Spouse name: Not on file   Number of children: Not on file   Years of education: Not on file   Highest education level: Not on file  Occupational History   Occupation: Retired  Tobacco Use   Smoking status: Former    Packs/day: 0.25    Years: 2.00    Total pack years: 0.50    Types: Cigarettes    Quit date: 01/11/1992    Years since quitting: 29.8   Smokeless tobacco: Never  Substance and Sexual Activity   Alcohol use: Yes    Comment: 2 glasses per day   Drug use: No   Sexual activity: Not on  file  Other Topics Concern   Not on file  Social History Narrative   Not on file   Social Determinants of Health   Financial Resource Strain: Not on file  Food Insecurity: Not on file  Transportation Needs: Not on file  Physical Activity: Not on file  Stress: Not on file  Social Connections: Not on file     Family History: The patient's family history includes Dementia in her mother; Healthy in her brother and father; Hypertension in her mother; Ovarian cancer in her mother. There is no history of Colon cancer, Colon polyps, Esophageal cancer, Rectal cancer, or Stomach cancer. History of coronary artery disease notable for no members. History of heart failure notable for no members. History of arrhythmia notable for father needing pacemaker. No history of bicuspid aortic valve or aortic aneurysm or dissection.  ROS:   Please see the  history of present illness.    All other systems reviewed and are negative.  EKGs/Labs/Other Studies Reviewed:    The following studies were reviewed today:  EKG:   02/24/20: SR Rate 76 with rare PACs  Cardiac Event Monitoring: Date: 03/20/20 Results: Patient had a minimum heart rate of 45 bpm, maximum heart rate of 266 bpm, and average heart rate of 73 bpm. Predominant underlying rhythm was sinus rhythm. One run of non-sustained ventricular tachycardia occurred lasting 7 beats at longest with a max rate of 255 bpm at fastest (morphologically this could be SVT with aberrancy). Forty-six runs of supraventricular tachycardia occurred lasting 17 seconds at longest with a max rate of 182 bpm at fastest. Isolated PACs were rare (<1.0%), with rare couplets and triplets present. Isolated PVCs were rare (<1.0%), with rare couplets and trigeminy present. No evidence of complete heart block. Triggered and diary events associated with sinus rhythm, sinus tachycardia, and PVCs.   Asymptomatic SVT.  CAC: Date: 01/29/2020 Personally Reviewed Results: LAD Calcium with aortic athersclerosis IMPRESSION: 1. Coronary calcium score is 332 and this is at percentile 93 for patients of the same age, gender and ethnicity. 2.  Aortic Atherosclerosis (ICD10-I70.0). 3. Small nodular structures in the visualized lungs. Largest nodular structure measures roughly 3 mm. These are likely incidental findings but indeterminate. No follow-up needed if patient is low-risk (and has no known or suspected primary neoplasm). Non-contrast chest CT can be considered in 12 months if patient is high-risk. This recommendation follows the consensus statement: Guidelines for Management of Incidental Pulmonary Nodules Detected on CT Images: From the Fleischner Society 2017; Radiology 2017; 284:228-243.   Recent Labs: 12/21/2020: ALT 26  Recent Lipid Panel    Component Value Date/Time   CHOL 165 12/21/2020 0834   TRIG  52 12/21/2020 0834   HDL 83 12/21/2020 0834   CHOLHDL 2.0 12/21/2020 0834   LDLCALC 71 12/21/2020 0834    Physical Exam:    VS:  BP 120/82   Pulse 60   Ht 5' 5.5" (1.664 m)   Wt 150 lb 12.8 oz (68.4 kg)   SpO2 99%   BMI 24.71 kg/m     Wt Readings from Last 3 Encounters:  11/05/21 150 lb 12.8 oz (68.4 kg)  09/28/20 142 lb (64.4 kg)  09/07/20 142 lb (64.4 kg)    GEN:  Well nourished, well developed in no acute distress HEENT: Normal NECK: No JVD; No carotid bruits  LYMPHATICS: No lymphadenopathy CARDIAC: regular rate, no murmurs, rubs, gallops RESPIRATORY:  Clear to auscultation without rales, wheezing or rhonchi  ABDOMEN: Soft, non-tender, non-distended MUSCULOSKELETAL:  No  edema; No deformity  SKIN: Warm and dry NEUROLOGIC:  Alert and oriented x 3 PSYCHIATRIC:  Normal affect   ASSESSMENT:    1. Precordial chest pain   2. Coronary artery calcification   3. Hyperlipidemia, unspecified hyperlipidemia type   4. Paroxysmal SVT (supraventricular tachycardia)   5. PAC (premature atrial contraction)   6. Essential hypertension   7. Aortic atherosclerosis (HCC)     PLAN:    Atypical chest pain - CCTA if persistent with biking and tennis  Coronary Artery Calcification Aortic Atherosclerosis HLD - ASA 81 mg PO daily -LDL goal less than 55 ( after long discussion) - increase to rosuvasattin 40 and zetia 10; labs in 3 months  HTN PACs and P-SVT - amb BP < 130/80 - HTN at goal on telmisartan 20 mg  - will prescribe diltiazem 30 mg PO Q64hr PRN (non needed)  One year    Medication Adjustments/Labs and Tests Ordered: Current medicines are reviewed at length with the patient today.  Concerns regarding medicines are outlined above.  Orders Placed This Encounter  Procedures   Lipid panel   ALT   EKG 12-Lead    Meds ordered this encounter  Medications   rosuvastatin (CRESTOR) 40 MG tablet    Sig: Take 1 tablet (40 mg total) by mouth daily.    Dispense:  90  tablet    Refill:  3   aspirin EC 81 MG tablet    Sig: Take 1 tablet (81 mg total) by mouth daily. Swallow whole.    Dispense:  30 tablet    Refill:  12     Patient Instructions  Medication Instructions:  Your physician has recommended you make the following change in your medication:  INCREASE: rosuvastatin (Crestor) to 40 mg by mouth once daily START: Aspirin 81 mg by mouth once daily  *If you need a refill on your cardiac medications before your next appointment, please call your pharmacy*   Lab Work: IN 3 MONTHS: FLP, ALT (nothing to eat or drink 8-12 hours prior except water and black coffee)  If you have labs (blood work) drawn today and your tests are completely normal, you will receive your results only by: [place] (if you have MyChart) OR A paper copy in the mail If you have any lab test that is abnormal or we need to change your treatment, we will call you to review the results.   Testing/Procedures: NONE   Follow-Up: At [facility], you and your health needs are our priority.  As part of our continuing mission to provide you with exceptional heart care, we have created designated Provider Care Teams.  These Care Teams include your primary Cardiologist (physician) and Advanced Practice Providers (APPs -  Physician Assistants and Nurse Practitioners) who all work together to provide you with the care you need, when you need it.  We recommend signing up for the patient portal called "MyChart".  Sign up information is provided on this After Visit Summary.  MyChart is used to connect with patients for Virtual Visits (Telemedicine).  Patients are able to view lab/test results, encounter notes, upcoming appointments, etc.  Non-urgent messages can be sent to your provider as well.   To learn more about what you can do with MyChart, go to [email or web address].    Your next appointment:   1 year(s)  The format for your next appointment:   In  Person  Provider:   Werner Lean, MD     Important Information  About Sugar         Signed, Werner Lean, MD  11/05/2021 10:07 AM    [facility]

## 2021-11-05 NOTE — Telephone Encounter (Signed)
I did not need this encounter. °

## 2021-11-05 NOTE — Patient Instructions (Signed)
Medication Instructions:  Your physician has recommended you make the following change in your medication:  INCREASE: rosuvastatin (Crestor) to 40 mg by mouth once daily START: Aspirin 81 mg by mouth once daily  *If you need a refill on your cardiac medications before your next appointment, please call your pharmacy*   Lab Work: IN 3 MONTHS: FLP, ALT (nothing to eat or drink 8-12 hours prior except water and black coffee)  If you have labs (blood work) drawn today and your tests are completely normal, you will receive your results only by: [place] (if you have MyChart) OR A paper copy in the mail If you have any lab test that is abnormal or we need to change your treatment, we will call you to review the results.   Testing/Procedures: NONE   Follow-Up: At [facility], you and your health needs are our priority.  As part of our continuing mission to provide you with exceptional heart care, we have created designated Provider Care Teams.  These Care Teams include your primary Cardiologist (physician) and Advanced Practice Providers (APPs -  Physician Assistants and Nurse Practitioners) who all work together to provide you with the care you need, when you need it.  We recommend signing up for the patient portal called "MyChart".  Sign up information is provided on this After Visit Summary.  MyChart is used to connect with patients for Virtual Visits (Telemedicine).  Patients are able to view lab/test results, encounter notes, upcoming appointments, etc.  Non-urgent messages can be sent to your provider as well.   To learn more about what you can do with MyChart, go to [email or web address].    Your next appointment:   1 year(s)  The format for your next appointment:   In Person  Provider:   Werner Lean, MD     Important Information About Sugar

## 2021-12-26 ENCOUNTER — Other Ambulatory Visit: Payer: Self-pay | Admitting: Internal Medicine

## 2022-02-09 ENCOUNTER — Ambulatory Visit: Payer: Medicare Other | Attending: Internal Medicine

## 2022-02-09 DIAGNOSIS — I2510 Atherosclerotic heart disease of native coronary artery without angina pectoris: Secondary | ICD-10-CM

## 2022-02-09 DIAGNOSIS — E785 Hyperlipidemia, unspecified: Secondary | ICD-10-CM

## 2022-02-10 LAB — LIPID PANEL
Chol/HDL Ratio: 1.8 ratio (ref 0.0–4.4)
Cholesterol, Total: 143 mg/dL (ref 100–199)
HDL: 78 mg/dL (ref >39)
LDL Chol Calc (NIH): 55 mg/dL (ref 0–99)
Triglycerides: 44 mg/dL (ref 0–149)
VLDL Cholesterol Cal: 10 mg/dL (ref 5–40)

## 2022-02-10 LAB — ALT: ALT: 39 IU/L — ABNORMAL HIGH (ref 0–32)

## 2022-02-10 MED ORDER — ROSUVASTATIN CALCIUM 20 MG PO TABS: 20.0000 mg | ORAL_TABLET | Freq: Every day | ORAL | 3 refills | Status: DC

## 2022-02-10 NOTE — Telephone Encounter (Signed)
Pt reports is taking rosuvastatin 20 mg 02/09/22 LDL 55.  MD okay with pt continuing 20 mg.  Script sent into pt pharmacy.

## 2022-03-03 ENCOUNTER — Telehealth: Payer: Self-pay | Admitting: Licensed Clinical Social Worker

## 2022-03-03 NOTE — Telephone Encounter (Signed)
Scheduled appt per 2/20 referral. Pt is aware of appt date and time. Pt is aware to arrive 15 mins prior to appt time and to bring and updated insurance card. Pt is aware of appt location.

## 2022-04-25 ENCOUNTER — Encounter: Payer: Self-pay | Admitting: Licensed Clinical Social Worker

## 2022-04-25 ENCOUNTER — Inpatient Hospital Stay: Payer: Medicare Other | Attending: Nurse Practitioner | Admitting: Licensed Clinical Social Worker

## 2022-04-25 ENCOUNTER — Inpatient Hospital Stay: Payer: Medicare Other

## 2022-04-25 DIAGNOSIS — Z808 Family history of malignant neoplasm of other organs or systems: Secondary | ICD-10-CM

## 2022-04-25 DIAGNOSIS — Z800 Family history of malignant neoplasm of digestive organs: Secondary | ICD-10-CM

## 2022-04-25 DIAGNOSIS — Z8041 Family history of malignant neoplasm of ovary: Secondary | ICD-10-CM | POA: Diagnosis not present

## 2022-04-25 LAB — GENETIC SCREENING ORDER

## 2022-04-25 NOTE — Progress Notes (Signed)
REFERRING PROVIDER: Rodrigo Ran, MD [address] [place],  [place] [address]  PRIMARY PROVIDER:  Rodrigo Ran, MD  PRIMARY REASON FOR VISIT:  1. Family history of ovarian cancer   2. Family history of stomach cancer   3. Family history of bone cancer      HISTORY OF PRESENT ILLNESS:   Jordan Wolfe, a 67 y.o. female, was seen for a [place] cancer genetics consultation at the request of Dr. Waynard Edwards due to a family history of cancer.  Jordan Wolfe presents to clinic today to discuss the possibility of a hereditary predisposition to cancer, genetic testing, and to further clarify her future cancer risks, as well as potential cancer risks for family members.   CANCER HISTORY:  Jordan Wolfe is a 67 y.o. female with no personal history of cancer.  She does report having hereditary cancer genetic testing through her GYN in 2018 or so. Copy of report unavailable for review.  RISK FACTORS:  Menarche was at age 26-13.  First live birth at age 34.  Ovaries intact: yes.  Hysterectomy: no.  Menopausal status: postmenopausal.  Colonoscopy: yes;  reports few polyps . Mammogram within the last year: yes. Number of breast biopsies: 0.  Past Medical History:  Diagnosis Date   Acute upper respiratory infection    Anxiety    Asymptomatic menopausal state    CAD (coronary artery disease)    Cough    COVID-19    Dehydration    Exhaustion due to excessive exertion    Hyperlipidemia    Hypertension    Malaise    Murmur, cardiac    Neck pain    Nocturia    RLS (restless legs syndrome)    Wheezing    Zoster without complications     No past surgical history on file.  FAMILY HISTORY:  We obtained a detailed, 4-generation family history.  Significant diagnoses are listed below: Family History  Problem Relation Age of Onset   Ovarian cancer Mother 40   Hypertension Mother    Dementia Mother    Healthy Father    Healthy Brother    Bone cancer Brother 94       chordoma   Brain cancer  Paternal Aunt    Stomach cancer Paternal Uncle    Colon cancer Neg Hx    Colon polyps Neg Hx    Esophageal cancer Neg Hx    Rectal cancer Neg Hx    Jordan Wolfe has 1 son, 16. She has 1 brother, 31, who was diagnosed with a chordoma at 57.   Jordan Wolfe's mother was diagnosed with ovarian cancer at 52 and died at 62. No other known cancers on maternal side.  Jordan Wolfe father is living at 84 with no cancers. Paternal aunt died of  a brain tumor. A paternal half uncle died of stomach cancer. No other known cancers on this side of the family.  Jordan Wolfe is aware of previous family history of genetic testing for hereditary cancer risks. There is no reported Ashkenazi Jewish ancestry. There is no known consanguinity.    GENETIC COUNSELING ASSESSMENT: Jordan Wolfe is a 67 y.o. female with a family history of ovarian cancer which is somewhat suggestive of a hereditary cancer syndrome and predisposition to cancer. We, therefore, discussed and recommended the following at today's visit.   DISCUSSION: We discussed that approximately 10-20% of ovarian cancer is hereditary. Most cases of hereditary ovarian cancer are associated with BRCA1/BRCA2 genes, although there are other genes associated  with hereditary cancer as well. Cancers and risks are gene specific. We discussed that testing is beneficial for several reasons including knowing about cancer risks, identifying potential screening and risk-reduction options that may be appropriate, and to understand if other family members could be at risk for cancer and allow them to undergo genetic testing.   We reviewed the characteristics, features and inheritance patterns of hereditary cancer syndromes. We also discussed genetic testing, including the appropriate family members to test, the process of testing, insurance coverage and turn-around-time for results. We discussed the implications of a negative, positive and/or variant of uncertain significant result. We  recommended Ms. Grondahl pursue genetic testing for the [facility] Multi-Cancer+RNA gene panel.   Based on Jordan Wolfe's family history of cancer, she meets medical criteria for genetic testing. Despite that she meets criteria, she may still have an out of pocket cost. We discussed that if her out of pocket cost for testing is over $100, the laboratory will call and confirm whether she wants to proceed with testing.  If the out of pocket cost of testing is less than $100 she will be billed by the genetic testing laboratory.   PLAN: After considering the risks, benefits, and limitations, Jordan Wolfe provided informed consent to pursue genetic testing and the blood sample was sent to [facility] for analysis of the Multi-Cancer+RNA panel. Results should be available within approximately 2-3 weeks' time, at which point they will be disclosed by telephone to Ms. Sofranko, as will any additional recommendations warranted by these results. Jordan Wolfe will receive a summary of her genetic counseling visit and a copy of her results once available. This information will also be available in Epic.   Jordan Wolfe questions were answered to her satisfaction today. Our contact information was provided should additional questions or concerns arise. Thank you for the referral and allowing [place] to share in the care of your patient.   Lacy Duverney, MS, [facility] Genetic Counselor [email or web address] Phone: [phone]  The patient was seen for a total of 20 minutes in face-to-face genetic counseling.  Dr. Orlie Dakin was available for discussion regarding this case.   _______________________________________________________________________ For Office Staff:  Number of people involved in session: 1 Was an Intern/ student involved with case: no

## 2022-05-02 ENCOUNTER — Telehealth: Payer: Self-pay | Admitting: Licensed Clinical Social Worker

## 2022-05-10 ENCOUNTER — Encounter: Payer: Self-pay | Admitting: Licensed Clinical Social Worker

## 2022-05-10 ENCOUNTER — Ambulatory Visit: Payer: Self-pay | Admitting: Licensed Clinical Social Worker

## 2022-05-10 DIAGNOSIS — Z1379 Encounter for other screening for genetic and chromosomal anomalies: Secondary | ICD-10-CM | POA: Insufficient documentation

## 2022-05-10 NOTE — Progress Notes (Signed)
HPI:   Jordan Wolfe was previously seen in the [place] [facility] clinic due to a family history of cancer and concerns regarding a hereditary predisposition to cancer. Please refer to our prior cancer genetics clinic note for more information regarding our discussion, assessment and recommendations, at the time. Jordan Wolfe recent genetic test results were disclosed to her, as were recommendations warranted by these results. These results and recommendations are discussed in more detail below.  CANCER HISTORY:  Oncology History   No history exists.    FAMILY HISTORY:  We obtained a detailed, 4-generation family history.  Significant diagnoses are listed below: Family History  Problem Relation Age of Onset   Ovarian cancer Mother 64   Hypertension Mother    Dementia Mother    Healthy Father    Healthy Brother    Bone cancer Brother 90       chordoma   Brain cancer Paternal Aunt    Stomach cancer Paternal Uncle    Colon cancer Neg Hx    Colon polyps Neg Hx    Esophageal cancer Neg Hx    Rectal cancer Neg Hx    Jordan Wolfe has 1 son, 31. She has 1 brother, 53, who was diagnosed with a chordoma at 34.    Jordan Wolfe's mother was diagnosed with ovarian cancer at 91 and died at 66. No other known cancers on maternal side.   Jordan Wolfe father is living at 71 with no cancers. Paternal aunt died of  a brain tumor. A paternal half uncle died of stomach cancer. No other known cancers on this side of the family.   Jordan Wolfe is aware of previous family history of genetic testing for hereditary cancer risks. There is no reported Ashkenazi Jewish ancestry. There is no known consanguinity.     GENETIC TEST RESULTS:  The [facility] Multi-Cancer Panel found no pathogenic mutations.   The Multi-Cancer Panel offered by [facility] includes sequencing and/or deletion/duplication analysis of the following 70 genes:  AIP*, ALK, APC*, ATM*, AXIN2*, BAP1*, BARD1*, BLM*, BMPR1A*, BRCA1*, BRCA2*, BRIP1*,  CDC73*, CDH1*, CDK4, CDKN1B*, CDKN2A, CHEK2*, CTNNA1*, DICER1*, EPCAM, EGFR, FH*, FLCN*, GREM1, HOXB13, KIT, LZTR1, MAX*, MBD4, MEN1*, MET, MITF, MLH1*, MSH2*, MSH3*, MSH6*, MUTYH*, NF1*, NF2*, NTHL1*, PALB2*, PDGFRA, PMS2*, POLD1*, POLE*, POT1*, PRKAR1A*, PTCH1*, PTEN*, RAD51C*, RAD51D*, RB1*, RET, SDHA*, SDHAF2*, SDHB*, SDHC*, SDHD*, SMAD4*, SMARCA4*, SMARCB1*, SMARCE1*, STK11*, SUFU*, TMEM127*, TP53*, TSC1*, TSC2*, VHL*.   The test report has been scanned into EPIC and is located under the Molecular Pathology section of the Results Review tab.  A portion of the result report is included below for reference. Genetic testing reported out on 05/01/2022.     Even though a pathogenic variant was not identified, possible explanations for the cancer in the family may include: There may be no hereditary risk for cancer in the family. The cancers in Jordan Wolfe and/or her family may be sporadic/familial or due to other genetic and environmental factors. There may be a gene mutation in one of these genes that current testing methods cannot detect but that chance is small. There could be another gene that has not yet been discovered, or that we have not yet tested, that is responsible for the cancer diagnoses in the family.  It is also possible there is a hereditary cause for the cancer in the family that Jordan Wolfe did not inherit.  Therefore, it is important to remain in touch with cancer genetics in the future so that we can continue  to offer Jordan Wolfe the most up to date genetic testing.   ADDITIONAL GENETIC TESTING:  We discussed with Jordan Wolfe that her genetic testing was fairly extensive.  If there are additional relevant genes identified to increase cancer risk that can be analyzed in the future, we would be happy to discuss and coordinate this testing at that time.    CANCER SCREENING RECOMMENDATIONS:  Jordan Wolfe test result is considered negative (normal).  This means that we have not identified a  hereditary cause for her family history of cancer at this time.   An individual's cancer risk and medical management are not determined by genetic test results alone. Overall cancer risk assessment incorporates additional factors, including personal medical history, family history, and any available genetic information that may result in a personalized plan for cancer prevention and surveillance. Therefore, it is recommended she continue to follow the cancer management and screening guidelines provided by her primary healthcare provider.  RECOMMENDATIONS FOR FAMILY MEMBERS:   Since she did not inherit a identifiable mutation in a cancer predisposition gene included on this panel, her children could not have inherited a known mutation from her in one of these genes. Individuals in this family might be at some increased risk of developing cancer, over the general population risk, due to the family history of cancer.  Individuals in the family should notify their providers of the family history of cancer. We recommend women in this family have a yearly mammogram beginning at age 20, or 44 years younger than the earliest onset of cancer, an annual clinical breast exam, and perform monthly breast self-exams.  Family members should have colonoscopies by at age 84, or earlier, as recommended by their providers. Other members of the family may still carry a pathogenic variant in one of these genes that Jordan Wolfe did not inherit. Based on the family history, we recommend her maternal relatives have genetic counseling and testing. Jordan Wolfe will let us know if we can be of any assistance in coordinating genetic counseling and/or testing for this family member.     FOLLOW-UP:  Lastly, we discussed with Jordan Wolfe that cancer genetics is a rapidly advancing field and it is possible that new genetic tests will be appropriate for her and/or her family members in the future. We encouraged her to remain in contact with  cancer genetics on an annual basis so we can update her personal and family histories and let her know of advances in cancer genetics that may benefit this family.   Our contact number was provided. Jordan Wolfe questions were answered to her satisfaction, and she knows she is welcome to call us at anytime with additional questions or concerns.    Lacy Duverney, MS, [facility] Genetic Counselor [email or web address] Phone: [phone]

## 2022-05-10 NOTE — Telephone Encounter (Signed)
I contacted Jordan Wolfe to discuss her genetic testing results. No pathogenic variants were identified in the 70 genes analyzed. Detailed clinic note to follow.   The test report has been scanned into EPIC and is located under the Molecular Pathology section of the Results Review tab.  A portion of the result report is included below for reference.      Lacy Duverney, MS, [facility] Genetic Counselor [email or web address] Phone: [phone]

## 2022-08-02 ENCOUNTER — Other Ambulatory Visit: Payer: Self-pay | Admitting: Medical

## 2022-08-02 ENCOUNTER — Ambulatory Visit
Admission: RE | Admit: 2022-08-02 | Discharge: 2022-08-02 | Disposition: A | Discharge status: Home / Self Care | Payer: Medicare Other | Source: Ambulatory Visit | Attending: Medical | Admitting: Medical

## 2022-08-02 ENCOUNTER — Encounter: Payer: Self-pay | Admitting: Medical

## 2022-08-02 DIAGNOSIS — M79602 Pain in left arm: Secondary | ICD-10-CM

## 2022-09-22 ENCOUNTER — Other Ambulatory Visit: Payer: Self-pay | Admitting: Sports Medicine

## 2022-09-22 DIAGNOSIS — M79672 Pain in left foot: Secondary | ICD-10-CM

## 2022-09-28 ENCOUNTER — Other Ambulatory Visit: Payer: Medicare Other

## 2022-11-16 ENCOUNTER — Other Ambulatory Visit: Payer: Self-pay | Admitting: Internal Medicine

## 2022-12-24 ENCOUNTER — Other Ambulatory Visit: Payer: Self-pay | Admitting: Internal Medicine

## 2023-01-24 ENCOUNTER — Other Ambulatory Visit: Payer: Self-pay | Admitting: Internal Medicine

## 2023-02-02 ENCOUNTER — Other Ambulatory Visit: Payer: Self-pay | Admitting: Internal Medicine

## 2023-02-13 ENCOUNTER — Other Ambulatory Visit: Payer: Self-pay | Admitting: Internal Medicine

## 2023-02-20 ENCOUNTER — Telehealth: Payer: Self-pay | Admitting: Internal Medicine

## 2023-02-20 MED ORDER — ASPIRIN 81 MG PO TBEC: 81.0000 mg | DELAYED_RELEASE_TABLET | Freq: Every day | ORAL | 1 refills | Status: DC

## 2023-02-20 NOTE — Telephone Encounter (Signed)
*  STAT* If patient is at the pharmacy, call can be transferred to refill team.   1. Which medications need to be refilled? (please list name of each medication and dose if known) ASPIRIN  LOW DOSE 81 MG tablet    4. Which pharmacy/location (including street and city if local pharmacy) is medication to be sent to? [id number] - [place], [place] - [address] AT [facility] OF [address]     5. Do they need a 30 day or 90 day supply? 30  Pt is scheduled for 05/29/23 with Dr. Paulita Boss

## 2023-03-10 ENCOUNTER — Other Ambulatory Visit: Payer: Self-pay | Admitting: Internal Medicine

## 2023-06-08 ENCOUNTER — Other Ambulatory Visit: Payer: Self-pay | Admitting: Internal Medicine

## 2023-06-09 ENCOUNTER — Ambulatory Visit: Payer: Medicare Other | Admitting: Internal Medicine

## 2023-07-05 ENCOUNTER — Ambulatory Visit: Attending: Cardiology | Admitting: Internal Medicine

## 2023-07-05 ENCOUNTER — Encounter: Payer: Self-pay | Admitting: Internal Medicine

## 2023-07-05 VITALS — BP 120/70 | HR 68 | Ht 65.5 in | Wt 153.0 lb

## 2023-07-05 DIAGNOSIS — I471 Supraventricular tachycardia, unspecified: Secondary | ICD-10-CM | POA: Diagnosis present

## 2023-07-05 DIAGNOSIS — E785 Hyperlipidemia, unspecified: Secondary | ICD-10-CM | POA: Diagnosis present

## 2023-07-05 DIAGNOSIS — I2510 Atherosclerotic heart disease of native coronary artery without angina pectoris: Secondary | ICD-10-CM | POA: Diagnosis not present

## 2023-07-05 MED ORDER — ASPIRIN 81 MG PO TBEC: 81.0000 mg | DELAYED_RELEASE_TABLET | Freq: Every day | ORAL | 3 refills | Status: DC

## 2023-07-05 MED ORDER — ASPIRIN 81 MG PO TBEC: 81.0000 mg | DELAYED_RELEASE_TABLET | Freq: Every day | ORAL | 3 refills | Status: AC

## 2023-07-05 NOTE — Patient Instructions (Addendum)
 Medication Instructions:  Your physician recommends that you continue on your current medications as directed. Please refer to the Current Medication list given to you today. REFILLED: Aspirin   STOPPED: Diltiazem    *If you need a refill on your cardiac medications before your next appointment, please call your pharmacy*  Lab Work: NONE  If you have labs (blood work) drawn today and your tests are completely normal, you will receive your results only by: MyChart Message (if you have MyChart) OR A paper copy in the mail If you have any lab test that is abnormal or we need to change your treatment, we will call you to review the results.  Testing/Procedures: NONE  Follow-Up:As needed At [facility], you and your health needs are our priority.  As part of our continuing mission to provide you with exceptional heart care, our providers are all part of one team.  This team includes your primary Cardiologist (physician) and Advanced Practice Providers or APPs (Physician Assistants and Nurse Practitioners) who all work together to provide you with the care you need, when you need it.   Provider:   Stanly DELENA Leavens, MD

## 2023-07-05 NOTE — Progress Notes (Signed)
 Cardiology Office Note:    Date:  07/05/2023   ID:  Jordan Wolfe, DOB 07/19/1955, MRN [id number]  PCP:  Jordan Anes, MD   Jordan Wolfe  Cardiologist:  Jordan Leavens MD Advanced Practice Provider:  No care team member to display Electrophysiologist:  None      CC: CAC f/u  History of Present Illness:    Jordan Wolfe is a 68 y.o. female with a hx of coronary artery calcification, HLD, HTN, Heart murmur NOS who presents for evaluation 02/24/20.  2022: In interim of this visit, patient had short asymptomatic runs of SVT on her ZioPatch and was given as needed Diltiazem .  Went to [place]. 2023: No symptoms; moved to more distant follow up  Jordan Wolfe is a 68 year old female with coronary artery calcifications, hyperlipidemia, and hypertension who presents for a cardiology follow-up.  She initially presented in 2022 with a heart murmur and asymptomatic supraventricular tachycardia. She was prescribed diltiazem  30 mg as needed, which she has not used in several years.  She is currently on rosuvastatin  20 mg and Zetia 10 mg for hyperlipidemia. Her blood pressure is well controlled on telmisartan 20 mg. She continues to take aspirin  for secondary prevention and requests a 90-day prescription.  She remains very active, engaging in activities such as cycling and tennis without symptoms. She reported one episode of elevated heart rate while cycling, which she attributed to exertion and possibly dehydration, but she was asymptomatic during this event.  No chest pain, chest pressure, or chest tightness. No symptoms of tachycardia or other arrhythmias.  Past Medical History:  Diagnosis Date   Acute upper respiratory infection    Anxiety    Asymptomatic menopausal state    CAD (coronary artery disease)    Cough    COVID-19    Dehydration    Exhaustion due to excessive exertion    Hyperlipidemia    Hypertension    Malaise    Murmur, cardiac    Neck pain     Nocturia    RLS (restless legs syndrome)    Wheezing    Zoster without complications     No past surgical history on file.  Current Medications: Current Meds  Medication Sig   cholecalciferol (VITAMIN D3) 25 MCG (1000 UNIT) tablet Take 1,000 Units by mouth daily.   ezetimibe (ZETIA) 10 MG tablet Take 10 mg by mouth daily.   LORazepam (ATIVAN) 0.5 MG tablet Take by mouth as needed.   rosuvastatin  (CRESTOR ) 20 MG tablet TAKE 1 TABLET(20 MG) BY MOUTH DAILY   telmisartan (MICARDIS) 20 MG tablet every evening.   zolpidem (AMBIEN) 5 MG tablet    [DISCONTINUED] aspirin  EC (ASPIRIN  LOW DOSE) 81 MG tablet Take 1 tablet (81 mg total) by mouth daily. Swallow whole.   [DISCONTINUED] diltiazem  (CARDIZEM ) 30 MG tablet Take 1 tablet (30 mg total) by mouth every 6 (six) hours as needed (SVT).   Current Facility-Administered Medications for the 07/05/23 encounter (Office Visit) with Wolfe Jordan LABOR, MD  Medication   0.9 %  sodium chloride  infusion     Allergies:   Patient has no known allergies.   Social History   Socioeconomic History   Marital status: Married    Spouse name: Not on file   Number of children: Not on file   Years of education: Not on file   Highest education level: Not on file  Occupational History   Occupation: Retired  Tobacco Use   Smoking  status: Former    Current packs/day: 0.00    Average packs/day: 0.3 packs/day for 2.0 years (0.5 ttl pk-yrs)    Types: Cigarettes    Start date: 01/10/1990    Quit date: 01/11/1992    Years since quitting: 31.5   Smokeless tobacco: Never  Substance and Sexual Activity   Alcohol  use: Yes    Comment: 2 glasses per day   Drug use: No   Sexual activity: Not on file  Other Topics Concern   Not on file  Social History Narrative   Not on file   Social Drivers of Health   Financial Resource Strain: Not on file  Food Insecurity: Not on file  Transportation Needs: Not on file  Physical Activity: Not on file  Stress: Not  on file  Social Connections: Not on file     Family History: The patient's family history includes Bone cancer (age of onset: 47) in her brother; Brain cancer in her paternal aunt; Dementia in her mother; Healthy in her brother and father; Hypertension in her mother; Ovarian cancer (age of onset: 65) in her mother; Stomach cancer in her paternal uncle. There is no history of Colon cancer, Colon polyps, Esophageal cancer, or Rectal cancer. History of coronary artery disease notable for no members. History of heart failure notable for no members. History of arrhythmia notable for father needing pacemaker. No history of bicuspid aortic valve or aortic aneurysm or dissection.  ROS:   Please see the history of present illness.     EKGs/Labs/Other Studies Reviewed:    The following studies were reviewed today:   Cardiac Studies & Procedures   ______________________________________________________________________________________________        SHERRILEE  LONG TERM MONITOR (3-14 DAYS) 03/20/2020  Narrative  Patient had a minimum heart rate of 45 bpm, maximum heart rate of 266 bpm, and average heart rate of 73 bpm.  Predominant underlying rhythm was sinus rhythm.  One run of non-sustained ventricular tachycardia occurred lasting 7 beats at longest with a max rate of 255 bpm at fastest (morphologically this could be SVT with aberrancy).  Forty-six runs of supraventricular tachycardia occurred lasting 17 seconds at longest with a max rate of 182 bpm at fastest.  Isolated PACs were rare (<1.0%), with rare couplets and triplets present.  Isolated PVCs were rare (<1.0%), with rare couplets and trigeminy present.  No evidence of complete heart block.  Triggered and diary events associated with sinus rhythm, sinus tachycardia, and PVCs.  Asymptomatic SVT.   CT SCANS  CT CARDIAC SCORING (SELF PAY ONLY) 01/29/2020  Narrative CLINICAL DATA:  68 year old white female with  hyperlipidemia.  EXAM: CT CARDIAC CORONARY ARTERY CALCIUM  SCORE  TECHNIQUE: Non-contrast imaging through the heart was performed using prospective ECG gating. Image post processing was performed on an independent workstation, allowing for quantitative analysis of the heart and coronary arteries. Note that this exam targets the heart and the chest was not imaged in its entirety.  COMPARISON:  None.  FINDINGS: CORONARY CALCIUM  SCORES:  Left Main: 0  LAD: 309  LCx: 3.3  RCA: 19.6  Total Agatston Score: 332  MESA database percentile: 93  AORTA MEASUREMENTS:  Ascending Aorta: 35 mm  Descending Aorta: 26 mm  OTHER FINDINGS:  Heart size is normal. Atherosclerotic calcifications at the aortic root. No significant pericardial fluid. Visualized mediastinal and hilar structures are unremarkable. Visualized upper abdominal structures are unremarkable. 2 mm nodule in the right middle lobe on sequence 9 image 46. 3 mm nodule along the  right major fissure on sequence 9, image 51. Small pleural-based densities in the posterior right lower lobe probably represent dependent densities and atelectasis. No significant airspace disease or consolidation in the visualized lungs. Punctate pleural-based density in the lingula on sequence 9, image 59. No acute bone abnormality  IMPRESSION: 1. Coronary calcium  score is 332 and this is at percentile 93 for patients of the same age, gender and ethnicity. 2.  Aortic Atherosclerosis (ICD10-I70.0). 3. Small nodular structures in the visualized lungs. Largest nodular structure measures roughly 3 mm. These are likely incidental findings but indeterminate. No follow-up needed if patient is low-risk (and has no known or suspected primary neoplasm). Non-contrast chest CT can be considered in 12 months if patient is high-risk. This recommendation follows the consensus statement: Guidelines for Management of Incidental Pulmonary Nodules  Detected on CT Images: From the Fleischner Society 2017; Radiology 2017; 284:228-243.   Electronically Signed By: Juliene Balder M.D. On: 01/29/2020 10:10     ______________________________________________________________________________________________        Recent Labs: No results found for requested labs within last 365 days.  Recent Lipid Panel    Component Value Date/Time   CHOL 143 02/09/2022 0726   TRIG 44 02/09/2022 0726   HDL 78 02/09/2022 0726   CHOLHDL 1.8 02/09/2022 0726   LDLCALC 55 02/09/2022 0726    Physical Exam:    VS:  BP 120/70 (BP Location: Left Arm, Cuff Size: Small)   Pulse 68   Ht 5' 5.5 (1.664 m)   Wt 153 lb (69.4 kg)   SpO2 94%   BMI 25.07 kg/m     Wt Readings from Last 3 Encounters:  07/05/23 153 lb (69.4 kg)  11/05/21 150 lb 12.8 oz (68.4 kg)  09/28/20 142 lb (64.4 kg)    GEN:  Well nourished, well developed in no acute distress HEENT: Normal NECK: No JVD CARDIAC: Regular rate, no murmurs, rubs, gallops RESPIRATORY:  Clear to auscultation without rales, wheezing or rhonchi  ABDOMEN: Soft, non-tender, non-distended MUSCULOSKELETAL:  No edema; No deformity  SKIN: Warm and dry NEUROLOGIC:  Alert and oriented x 3 PSYCHIATRIC:  Normal affect   ASSESSMENT:    1. Coronary artery calcification   2. Hyperlipidemia, unspecified hyperlipidemia type   3. Paroxysmal SVT (supraventricular tachycardia) (HCC)      PLAN:     Coronary artery calcifications Aortic atherosclerosis HLD - Coronary artery calcifications identified incidentally. She is asymptomatic with no chest pain, pressure, or tightness, and remains active. No evidence of acute coronary syndrome. - Continue medications and lifestyle modifications. - Emphasize active lifestyle and healthy diet. - Continue rosuvastatin  20 mg and Zetia 10 mg. - Maintain current LDL goal.  Hypertension - Hypertension is well controlled with telmisartan. Blood pressure goals are being met. -  Continue telmisartan 20 mg. - Regular blood pressure monitoring.  PACs Rare, Supraventricular tachycardia Rare, asymptomatic supraventricular tachycardia with no recent episodes. One episode of elevated heart rate likely due to exertion and possible dehydration. No atrial fibrillation, atrial flutter, or non-sustained ventricular tachycardia observed. - Discontinue diltiazem  PRN (never needed)   Patient is able to safely graduate to primary care practice - Preoperative cardiac risk stratification: as per PCP unless clinical change in cardiac symptoms - Secondary prevention strategy: -- HTN < 130/80 met on current therapy -- HLD LDL goal under < 55 met on therapy -- Medication Transition: ASA, zetia and rosuvastatin  will need come via PCP office starting in 2026  Our team is happy to see back if  new changes  Jordan Leavens, MD FASE [facility] Cardiologist [facility]  [address] [place], [address] [place], [place] [address] [phone]  9:22 AM    Medication Adjustments/Labs and Tests Ordered: Current medicines are reviewed at length with the patient today.  Concerns regarding medicines are outlined above.  Orders Placed This Encounter  Procedures   EKG 12-Lead    Meds ordered this encounter  Medications   DISCONTD: aspirin  EC (ASPIRIN  LOW DOSE) 81 MG tablet    Sig: Take 1 tablet (81 mg total) by mouth daily. Swallow whole.    Dispense:  90 tablet    Refill:  3    Pt must keep followup appt with Cardiology in May 2025 for any more refills. 1st attempt Thank You   aspirin  EC (ASPIRIN  LOW DOSE) 81 MG tablet    Sig: Take 1 tablet (81 mg total) by mouth daily. Swallow whole.    Dispense:  90 tablet    Refill:  3    Discontinue Diltiazem      Patient Instructions  Medication Instructions:  Your physician recommends that you continue on your current medications as directed. Please refer to the Current Medication list given to you today. REFILLED:  Aspirin   STOPPED: Diltiazem    *If you need a refill on your cardiac medications before your next appointment, please call your pharmacy*  Lab Work: NONE  If you have labs (blood work) drawn today and your tests are completely normal, you will receive your results only by: MyChart Message (if you have MyChart) OR A paper copy in the mail If you have any lab test that is abnormal or we need to change your treatment, we will call you to review the results.  Testing/Procedures: NONE  Follow-Up:As needed At [facility], you and your health needs are our priority.  As part of our continuing mission to provide you with exceptional heart care, our providers are all part of one team.  This team includes your primary Cardiologist (physician) and Advanced Practice Providers or APPs (Physician Assistants and Nurse Practitioners) who all work together to provide you with the care you need, when you need it.   Provider:   Stanly DELENA Leavens, MD           Jordan Leavens, MD FASE [facility] Cardiologist [facility]  [address] [place], [place] [address] [phone]  9:22 AM

## 2023-07-15 ENCOUNTER — Other Ambulatory Visit: Payer: Self-pay | Admitting: Internal Medicine
